# Patient Record
Sex: Female | Born: 1950 | Race: White | Hispanic: No | Smoking: Never smoker
Health system: Southern US, Community
[De-identification: ages and names within clinical notes are randomized; demographics above are authoritative.]

## PROBLEM LIST (undated history)

## (undated) DIAGNOSIS — R112 Nausea with vomiting, unspecified: Secondary | ICD-10-CM

## (undated) DIAGNOSIS — F329 Major depressive disorder, single episode, unspecified: Secondary | ICD-10-CM

## (undated) DIAGNOSIS — Z9889 Other specified postprocedural states: Secondary | ICD-10-CM

## (undated) DIAGNOSIS — I10 Essential (primary) hypertension: Secondary | ICD-10-CM

## (undated) DIAGNOSIS — F32A Depression, unspecified: Secondary | ICD-10-CM

## (undated) DIAGNOSIS — F419 Anxiety disorder, unspecified: Secondary | ICD-10-CM

## (undated) DIAGNOSIS — J069 Acute upper respiratory infection, unspecified: Secondary | ICD-10-CM

## (undated) DIAGNOSIS — R0602 Shortness of breath: Secondary | ICD-10-CM

## (undated) HISTORY — PX: TONSILLECTOMY: SUR1361

## (undated) HISTORY — PX: APPENDECTOMY: SHX54

## (undated) HISTORY — PX: STAPEDECTOMY: SHX2435

## (undated) HISTORY — PX: ELBOW SURGERY: SHX618

---

## 1997-12-20 ENCOUNTER — Other Ambulatory Visit: Admission: RE | Admit: 1997-12-20 | Discharge: 1997-12-20 | Payer: Self-pay | Admitting: Gynecology

## 1999-01-12 ENCOUNTER — Other Ambulatory Visit: Admission: RE | Admit: 1999-01-12 | Discharge: 1999-01-12 | Payer: Self-pay | Admitting: Gynecology

## 2000-02-01 ENCOUNTER — Other Ambulatory Visit: Admission: RE | Admit: 2000-02-01 | Discharge: 2000-02-01 | Payer: Self-pay | Admitting: Gynecology

## 2001-04-23 ENCOUNTER — Other Ambulatory Visit: Admission: RE | Admit: 2001-04-23 | Discharge: 2001-04-23 | Payer: Self-pay | Admitting: Gynecology

## 2004-11-07 ENCOUNTER — Other Ambulatory Visit: Admission: RE | Admit: 2004-11-07 | Discharge: 2004-11-07 | Payer: Self-pay | Admitting: Obstetrics & Gynecology

## 2006-12-10 LAB — HM COLONOSCOPY

## 2011-03-20 ENCOUNTER — Encounter (HOSPITAL_COMMUNITY)
Admission: RE | Admit: 2011-03-20 | Discharge: 2011-03-20 | Disposition: A | Discharge status: Home / Self Care | Payer: BC Managed Care – PPO | Source: Ambulatory Visit | Attending: Neurosurgery | Admitting: Neurosurgery

## 2011-03-20 ENCOUNTER — Encounter (HOSPITAL_COMMUNITY): Payer: Self-pay

## 2011-03-20 ENCOUNTER — Encounter (HOSPITAL_COMMUNITY): Payer: Self-pay | Admitting: Anesthesiology

## 2011-03-20 HISTORY — DX: Essential (primary) hypertension: I10

## 2011-03-20 HISTORY — DX: Anxiety disorder, unspecified: F41.9

## 2011-03-20 HISTORY — DX: Shortness of breath: R06.02

## 2011-03-20 HISTORY — DX: Acute upper respiratory infection, unspecified: J06.9

## 2011-03-20 HISTORY — DX: Depression, unspecified: F32.A

## 2011-03-20 HISTORY — DX: Major depressive disorder, single episode, unspecified: F32.9

## 2011-03-20 HISTORY — DX: Nausea with vomiting, unspecified: Z98.890

## 2011-03-20 HISTORY — DX: Nausea with vomiting, unspecified: R11.2

## 2011-03-20 LAB — BASIC METABOLIC PANEL
BUN: 12 mg/dL (ref 6–23)
Chloride: 103 mEq/L (ref 96–112)
GFR calc non Af Amer: >90 mL/min (ref >90)
Glucose, Bld: 105 mg/dL — ABNORMAL HIGH (ref 70–99)
Potassium: 4 mEq/L (ref 3.5–5.1)
Sodium: 142 mEq/L (ref 135–145)

## 2011-03-20 LAB — SURGICAL PCR SCREEN: Staphylococcus aureus: NEGATIVE

## 2011-03-20 LAB — CBC
HCT: 39.5 % (ref 36.0–46.0)
Hemoglobin: 13.2 g/dL (ref 12.0–15.0)
MCHC: 33.4 g/dL (ref 30.0–36.0)
RBC: 4.22 MIL/uL (ref 3.87–5.11)
WBC: 6.6 10*3/uL (ref 4.0–10.5)

## 2011-03-20 NOTE — Pre-Procedure Instructions (Signed)
[name]  03/20/2011   Your procedure is scheduled on:  03/28/11 wednesday  Report to [facility] at 6:30 AM.  Call this number if you have problems the morning of surgery: [phone]   Remember:   Do not eat food:After Midnight.  Do not drink clear liquids: 4 Hours before arrival.  Take these medicines the morning of surgery with A SIP OF WATER: albuterol,zyrtec,nasonex,dulera,percocet,zoloft,diovan   Do not wear jewelry, make-up or nail polish.  Do not wear lotions, powders, or perfumes. You may wear deodorant.  Do not shave 48 hours prior to surgery.  Do not bring valuables to the hospital.  Contacts, dentures or bridgework may not be worn into surgery.  Leave suitcase in the car. After surgery it may be brought to your room.  For patients admitted to the hospital, checkout time is 11:00 AM the day of discharge.   Patients discharged the day of surgery will not be allowed to drive home.  Name and phone number of your driver:  dorinda stehr [phone] or peggy hinshaw [phone]  Special Instructions: CHG Shower Use Special Wash: 1/2 bottle night before surgery and 1/2 bottle morning of surgery.   Please read over the following fact sheets that you were given: Coughing and Deep Breathing, MRSA Information and Surgical Site Infection Prevention

## 2011-03-21 NOTE — Anesthesia Preprocedure Evaluation (Addendum)
Anesthesia Evaluation  Patient identified by MRN, date of birth, ID band Patient awake    Reviewed: Allergy & Precautions, H&P , NPO status , Patient's Chart, lab work & pertinent test results  History of Anesthesia Complications (+) PONV  Airway Mallampati: II TM Distance: >3 FB Neck ROM: Full    Dental  (+) Dental Advisory Given and Dental Advidsory Given,    Pulmonary shortness of breath and with exertion, asthma , Recent URI , Resolved,          Cardiovascular hypertension, Pt. on medications and Pt. on home beta blockers     Neuro/Psych PSYCHIATRIC DISORDERS Anxiety Depression    GI/Hepatic   Endo/Other    Renal/GU      Musculoskeletal   Abdominal   Peds  Hematology   Anesthesia Other Findings   Reproductive/Obstetrics                         Anesthesia Physical Anesthesia Plan  ASA: III  Anesthesia Plan: General   Post-op Pain Management:    Induction: Intravenous  Airway Management Planned: Oral ETT  Additional Equipment:   Intra-op Plan:   Post-operative Plan: Extubation in OR  Informed Consent: I have reviewed the patients History and Physical, chart, labs and discussed the procedure including the risks, benefits and alternatives for the proposed anesthesia with the patient or authorized representative who has indicated his/her understanding and acceptance.   Dental advisory given  Plan Discussed with: CRNA, Anesthesiologist and Surgeon  Anesthesia Plan Comments:       Anesthesia Quick Evaluation

## 2011-03-22 NOTE — Pre-Procedure Instructions (Signed)
This

## 2011-03-24 NOTE — Preoperative (Addendum)
Beta Blockers   Reason not to administer Beta Blockers:Not Applicable 

## 2011-03-24 NOTE — Transfer of Care (Signed)
Immediate Anesthesia Transfer of Care Note  Patient: Penny Vega  Procedure(s) Performed:  LUMBAR LAMINECTOMY/DECOMPRESSION MICRODISCECTOMY - L2-3 Left Microdiscectomy  Patient Location: PACU  Anesthesia Type: General  Level of Consciousness: awake and alert   Airway & Oxygen Therapy: Patient Spontanous Breathing and Patient connected to nasal cannula oxygen  Post-op Assessment: Report given to PACU RN  Post vital signs: Reviewed and stable  Complications: No apparent anesthesia complications

## 2011-03-24 NOTE — Transfer of Care (Signed)
Immediate Anesthesia Transfer of Care Note  Patient: Penny Vega  Procedure(s) Performed:  LUMBAR LAMINECTOMY/DECOMPRESSION MICRODISCECTOMY - L2-3 Left Microdiscectomy  Patient Location: PACU  Anesthesia Type: General  Level of Consciousness: awake, alert  and oriented  Airway & Oxygen Therapy: Patient connected to nasal cannula oxygen  Post-op Assessment: Report given to PACU RN  Post vital signs: Reviewed and stable  Complications: No apparent anesthesia complications

## 2011-03-27 ENCOUNTER — Encounter (HOSPITAL_COMMUNITY): Payer: Self-pay

## 2011-03-27 MED ORDER — CEFAZOLIN SODIUM 1-5 GM-% IV SOLN
1.0000 g | INTRAVENOUS | Status: DC
  Filled 2011-03-27: qty 50

## 2011-03-28 ENCOUNTER — Encounter (HOSPITAL_COMMUNITY): Payer: Self-pay | Admitting: Anesthesiology

## 2011-03-28 ENCOUNTER — Ambulatory Visit (HOSPITAL_COMMUNITY): Payer: BC Managed Care – PPO

## 2011-03-28 ENCOUNTER — Encounter (HOSPITAL_COMMUNITY): Payer: Self-pay

## 2011-03-28 ENCOUNTER — Ambulatory Visit (HOSPITAL_COMMUNITY)
Admission: RE | Admit: 2011-03-28 | Discharge: 2011-03-29 | Disposition: A | Discharge status: Home / Self Care | Payer: BC Managed Care – PPO | Source: Ambulatory Visit | Attending: Neurosurgery | Admitting: Neurosurgery

## 2011-03-28 ENCOUNTER — Encounter (HOSPITAL_COMMUNITY)
Admission: RE | Disposition: A | Discharge status: Home / Self Care | Payer: Self-pay | Source: Ambulatory Visit | Attending: Neurosurgery

## 2011-03-28 DIAGNOSIS — F3289 Other specified depressive episodes: Secondary | ICD-10-CM | POA: Insufficient documentation

## 2011-03-28 DIAGNOSIS — Z01812 Encounter for preprocedural laboratory examination: Secondary | ICD-10-CM | POA: Insufficient documentation

## 2011-03-28 DIAGNOSIS — F411 Generalized anxiety disorder: Secondary | ICD-10-CM | POA: Insufficient documentation

## 2011-03-28 DIAGNOSIS — I10 Essential (primary) hypertension: Secondary | ICD-10-CM | POA: Insufficient documentation

## 2011-03-28 DIAGNOSIS — J45909 Unspecified asthma, uncomplicated: Secondary | ICD-10-CM | POA: Insufficient documentation

## 2011-03-28 DIAGNOSIS — Z01810 Encounter for preprocedural cardiovascular examination: Secondary | ICD-10-CM | POA: Insufficient documentation

## 2011-03-28 DIAGNOSIS — M5126 Other intervertebral disc displacement, lumbar region: Secondary | ICD-10-CM | POA: Insufficient documentation

## 2011-03-28 DIAGNOSIS — F329 Major depressive disorder, single episode, unspecified: Secondary | ICD-10-CM | POA: Insufficient documentation

## 2011-03-28 HISTORY — PX: LUMBAR LAMINECTOMY/DECOMPRESSION MICRODISCECTOMY: SHX5026

## 2011-03-28 SURGERY — LUMBAR LAMINECTOMY/DECOMPRESSION MICRODISCECTOMY
Anesthesia: General | Site: Back | Laterality: Left | Wound class: Clean

## 2011-03-28 MED ORDER — CEFAZOLIN SODIUM 1-5 GM-% IV SOLN
1.0000 g | Freq: Three times a day (TID) | INTRAVENOUS | Status: AC
  Administered 2011-03-28 (×2): 1 g via INTRAVENOUS
  Filled 2011-03-28 (×2): qty 50

## 2011-03-28 MED ORDER — PHENOL 1.4 % MT LIQD: 1.0000 | OROMUCOSAL | Status: DC | PRN

## 2011-03-28 MED ORDER — LACTATED RINGERS IV SOLN
INTRAVENOUS | Status: DC | PRN
  Administered 2011-03-28 (×2): via INTRAVENOUS

## 2011-03-28 MED ORDER — EPHEDRINE SULFATE 50 MG/ML IJ SOLN
INTRAMUSCULAR | Status: DC | PRN
  Administered 2011-03-28 (×2): 10 mg via INTRAVENOUS

## 2011-03-28 MED ORDER — MORPHINE SULFATE 2 MG/ML IJ SOLN: 0.0500 mg/kg | INTRAMUSCULAR | Status: DC | PRN

## 2011-03-28 MED ORDER — HYDROMORPHONE HCL PF 1 MG/ML IJ SOLN: 0.2500 mg | INTRAMUSCULAR | Status: DC | PRN

## 2011-03-28 MED ORDER — HYDROMORPHONE HCL PF 1 MG/ML IJ SOLN
0.2500 mg | INTRAMUSCULAR | Status: DC | PRN
  Administered 2011-03-28: 0.25 mg via INTRAVENOUS
  Administered 2011-03-28: 0.5 mg via INTRAVENOUS

## 2011-03-28 MED ORDER — CEFAZOLIN SODIUM 1-5 GM-% IV SOLN
INTRAVENOUS | Status: DC | PRN
  Administered 2011-03-28: 1 g via INTRAVENOUS

## 2011-03-28 MED ORDER — HYDROMORPHONE HCL PF 1 MG/ML IJ SOLN
0.5000 mg | INTRAMUSCULAR | Status: DC | PRN
  Administered 2011-03-28: 1 mg via INTRAVENOUS
  Filled 2011-03-28: qty 1

## 2011-03-28 MED ORDER — PHENYLEPHRINE HCL 10 MG/ML IJ SOLN
INTRAMUSCULAR | Status: DC | PRN
  Administered 2011-03-28 (×2): 160 ug via INTRAVENOUS
  Administered 2011-03-28: 80 ug via INTRAVENOUS
  Administered 2011-03-28: 240 ug via INTRAVENOUS

## 2011-03-28 MED ORDER — SUCCINYLCHOLINE CHLORIDE 20 MG/ML IJ SOLN
INTRAMUSCULAR | Status: DC | PRN
  Administered 2011-03-28: 50 mg via INTRAVENOUS

## 2011-03-28 MED ORDER — DEXAMETHASONE SODIUM PHOSPHATE 4 MG/ML IJ SOLN
INTRAMUSCULAR | Status: DC | PRN
  Administered 2011-03-28: 10 mg via INTRAVENOUS

## 2011-03-28 MED ORDER — CYCLOBENZAPRINE HCL 10 MG PO TABS
10.0000 mg | ORAL_TABLET | Freq: Three times a day (TID) | ORAL | Status: DC | PRN
  Administered 2011-03-28: 10 mg via ORAL
  Filled 2011-03-28: qty 1

## 2011-03-28 MED ORDER — DOCUSATE SODIUM 100 MG PO CAPS
100.0000 mg | ORAL_CAPSULE | Freq: Two times a day (BID) | ORAL | Status: DC
  Administered 2011-03-28: 100 mg via ORAL
  Filled 2011-03-28: qty 1

## 2011-03-28 MED ORDER — GLYCOPYRROLATE 0.2 MG/ML IJ SOLN
INTRAMUSCULAR | Status: DC | PRN
  Administered 2011-03-28: .8 mg via INTRAVENOUS

## 2011-03-28 MED ORDER — FENTANYL CITRATE 0.05 MG/ML IJ SOLN
INTRAMUSCULAR | Status: DC | PRN
  Administered 2011-03-28: 100 ug via INTRAVENOUS
  Administered 2011-03-28: 50 ug via INTRAVENOUS

## 2011-03-28 MED ORDER — LACTATED RINGERS IV SOLN: INTRAVENOUS | Status: DC

## 2011-03-28 MED ORDER — NEOSTIGMINE METHYLSULFATE 1 MG/ML IJ SOLN
INTRAMUSCULAR | Status: DC | PRN
  Administered 2011-03-28: 4 mg via INTRAVENOUS

## 2011-03-28 MED ORDER — LIDOCAINE-EPINEPHRINE 1 %-1:100000 IJ SOLN
INTRAMUSCULAR | Status: DC | PRN
  Administered 2011-03-28: 10 mL via INTRADERMAL

## 2011-03-28 MED ORDER — PROPOFOL 10 MG/ML IV EMUL
INTRAVENOUS | Status: DC | PRN
  Administered 2011-03-28: 150 mg via INTRAVENOUS

## 2011-03-28 MED ORDER — SODIUM CHLORIDE 0.9 % IR SOLN
Status: DC | PRN
  Administered 2011-03-28: 09:00:00

## 2011-03-28 MED ORDER — BUPIVACAINE HCL (PF) 0.25 % IJ SOLN
INTRAMUSCULAR | Status: DC | PRN
  Administered 2011-03-28: 10 mL

## 2011-03-28 MED ORDER — ONDANSETRON HCL 4 MG/2ML IJ SOLN
INTRAMUSCULAR | Status: DC | PRN
  Administered 2011-03-28: 4 mg via INTRAVENOUS

## 2011-03-28 MED ORDER — SODIUM CHLORIDE 0.9 % IJ SOLN: 3.0000 mL | Freq: Two times a day (BID) | INTRAMUSCULAR | Status: DC

## 2011-03-28 MED ORDER — MIDAZOLAM HCL 5 MG/5ML IJ SOLN
INTRAMUSCULAR | Status: DC | PRN
  Administered 2011-03-28: 2 mg via INTRAVENOUS

## 2011-03-28 MED ORDER — VECURONIUM BROMIDE 10 MG IV SOLR
INTRAVENOUS | Status: DC | PRN
  Administered 2011-03-28: 5 mg via INTRAVENOUS

## 2011-03-28 MED ORDER — OXYCODONE-ACETAMINOPHEN 5-325 MG PO TABS
1.0000 | ORAL_TABLET | ORAL | Status: DC | PRN
  Administered 2011-03-28 (×2): 1 via ORAL
  Administered 2011-03-28 – 2011-03-29 (×3): 2 via ORAL
  Filled 2011-03-28 (×4): qty 2

## 2011-03-28 MED ORDER — MENTHOL 3 MG MT LOZG: 1.0000 | LOZENGE | OROMUCOSAL | Status: DC | PRN

## 2011-03-28 MED ORDER — ACETAMINOPHEN 325 MG PO TABS: 650.0000 mg | ORAL_TABLET | ORAL | Status: DC | PRN

## 2011-03-28 MED ORDER — ACETAMINOPHEN 650 MG RE SUPP: 650.0000 mg | RECTAL | Status: DC | PRN

## 2011-03-28 MED ORDER — THROMBIN 5000 UNITS EX KIT
PACK | CUTANEOUS | Status: DC | PRN
  Administered 2011-03-28: 10000 [IU] via TOPICAL

## 2011-03-28 SURGICAL SUPPLY — 54 items
ADH SKN CLS APL DERMABOND .7 (GAUZE/BANDAGES/DRESSINGS) ×1
APL SKNCLS STERI-STRIP NONHPOA (GAUZE/BANDAGES/DRESSINGS) ×1
BAG DECANTER FOR FLEXI CONT (MISCELLANEOUS) ×2 IMPLANT
BENZOIN TINCTURE PRP APPL 2/3 (GAUZE/BANDAGES/DRESSINGS) ×2 IMPLANT
BLADE SURG 11 STRL SS (BLADE) ×2 IMPLANT
BLADE SURG ROTATE 9660 (MISCELLANEOUS) IMPLANT
BRUSH SCRUB EZ PLAIN DRY (MISCELLANEOUS) ×2 IMPLANT
BUR MATCHSTICK NEURO 3.0 LAGG (BURR) ×2 IMPLANT
BUR PRECISION FLUTE 6.0 (BURR) ×2 IMPLANT
CANISTER SUCTION 2500CC (MISCELLANEOUS) ×2 IMPLANT
CLOSURE STERI STRIP 1/2 X4 (GAUZE/BANDAGES/DRESSINGS) ×2 IMPLANT
CLOTH BEACON ORANGE TIMEOUT ST (SAFETY) ×2 IMPLANT
CONT SPEC 4OZ CLIKSEAL STRL BL (MISCELLANEOUS) ×2 IMPLANT
DECANTER SPIKE VIAL GLASS SM (MISCELLANEOUS) ×2 IMPLANT
DERMABOND ADVANCED (GAUZE/BANDAGES/DRESSINGS) ×1
DERMABOND ADVANCED .7 DNX12 (GAUZE/BANDAGES/DRESSINGS) ×1 IMPLANT
DRAPE LAPAROTOMY 100X72X124 (DRAPES) ×2 IMPLANT
DRAPE MICROSCOPE ZEISS OPMI (DRAPES) ×2 IMPLANT
DRAPE POUCH INSTRU U-SHP 10X18 (DRAPES) ×2 IMPLANT
DRAPE PROXIMA HALF (DRAPES) IMPLANT
DRAPE SURG 17X23 STRL (DRAPES) ×2 IMPLANT
DRSG OPSITE 4X5.5 SM (GAUZE/BANDAGES/DRESSINGS) ×2 IMPLANT
ELECT REM PT RETURN 9FT ADLT (ELECTROSURGICAL) ×2
ELECTRODE REM PT RTRN 9FT ADLT (ELECTROSURGICAL) ×1 IMPLANT
GAUZE SPONGE 4X4 12PLY STRL LF (GAUZE/BANDAGES/DRESSINGS) ×2 IMPLANT
GAUZE SPONGE 4X4 16PLY XRAY LF (GAUZE/BANDAGES/DRESSINGS) IMPLANT
GLOVE BIO SURGEON STRL SZ8 (GLOVE) ×4 IMPLANT
GLOVE ECLIPSE 7.5 STRL STRAW (GLOVE) IMPLANT
GLOVE EXAM NITRILE LRG STRL (GLOVE) IMPLANT
GLOVE EXAM NITRILE MD LF STRL (GLOVE) ×2 IMPLANT
GLOVE EXAM NITRILE XL STR (GLOVE) IMPLANT
GLOVE EXAM NITRILE XS STR PU (GLOVE) IMPLANT
GLOVE INDICATOR 8.5 STRL (GLOVE) ×2 IMPLANT
GOWN BRE IMP SLV AUR LG STRL (GOWN DISPOSABLE) ×2 IMPLANT
GOWN BRE IMP SLV AUR XL STRL (GOWN DISPOSABLE) ×4 IMPLANT
GOWN STRL REIN 2XL LVL4 (GOWN DISPOSABLE) IMPLANT
KIT BASIN OR (CUSTOM PROCEDURE TRAY) ×2 IMPLANT
KIT ROOM TURNOVER OR (KITS) ×2 IMPLANT
NEEDLE HYPO 22GX1.5 SAFETY (NEEDLE) ×2 IMPLANT
NEEDLE SPNL 22GX3.5 QUINCKE BK (NEEDLE) ×2 IMPLANT
NS IRRIG 1000ML POUR BTL (IV SOLUTION) ×2 IMPLANT
PACK LAMINECTOMY NEURO (CUSTOM PROCEDURE TRAY) ×2 IMPLANT
RUBBERBAND STERILE (MISCELLANEOUS) ×4 IMPLANT
SPONGE GAUZE 4X4 12PLY (GAUZE/BANDAGES/DRESSINGS) ×2 IMPLANT
SPONGE SURGIFOAM ABS GEL SZ50 (HEMOSTASIS) ×2 IMPLANT
STRIP CLOSURE SKIN 1/2X4 (GAUZE/BANDAGES/DRESSINGS) ×2 IMPLANT
SUT VIC AB 0 CT1 18XCR BRD8 (SUTURE) ×1 IMPLANT
SUT VIC AB 0 CT1 8-18 (SUTURE) ×2
SUT VIC AB 2-0 CT1 18 (SUTURE) ×2 IMPLANT
SUT VICRYL 4-0 PS2 18IN ABS (SUTURE) ×2 IMPLANT
SYR 20ML ECCENTRIC (SYRINGE) ×2 IMPLANT
TOWEL OR 17X24 6PK STRL BLUE (TOWEL DISPOSABLE) ×2 IMPLANT
TOWEL OR 17X26 10 PK STRL BLUE (TOWEL DISPOSABLE) ×2 IMPLANT
WATER STERILE IRR 1000ML POUR (IV SOLUTION) ×2 IMPLANT

## 2011-03-28 NOTE — H&P (Signed)
Penny Vega is an 60 y.o. female.   Chief Complaint: Back and left leg pain HPI: The patient is a 60 year old female who presents for evaluation of back and left leg pain that began back in August. She reports the pain radiates to her left buttock down the anterior margins of her left quad and inner part of her left thigh. This is refractory to all forms of conservative treatment. To include but not limited to anti-inflammatories prednisone physical therapy. The patient now presents for lumbar laminectomy microdiscectomy.  Past Medical History  Diagnosis Date  . PONV (postoperative nausea and vomiting)   . Asthma     last attack was 06/2010  . Shortness of breath     with asthma symptoms  . Recurrent upper respiratory infection (URI)     bronchitis early spring  . Anxiety   . Depression   . Hypertension     Past Surgical History  Procedure Date  . Tonsillectomy   . Appendectomy   . Stapedectomy 20 yrs. and 25 yrs. ago  . Elbow surgery 10 yrs. ago    left     No family history on file. Social History:  reports that she has never smoked. She does not have any smokeless tobacco history on file. She reports that she does not drink alcohol or use illicit drugs.  Allergies:  Allergies  Allergen Reactions  . No Known Drug Allergy     Medications Prior to Admission  Medication Dose Route Frequency Provider Last Rate Last Dose  . ceFAZolin (ANCEF) IVPB 1 g/50 mL premix  1 g Intravenous 60 min Pre-Op Mariam Dollar       Medications Prior to Admission  Medication Sig Dispense Refill  . albuterol (PROVENTIL HFA;VENTOLIN HFA) 108 (90 BASE) MCG/ACT inhaler Inhale 2 puffs into the lungs every 6 (six) hours as needed. For asthma       . ALPRAZolam (XANAX) 0.5 MG tablet Take 0.25 mg by mouth at bedtime as needed. For sleep       . amitriptyline (ELAVIL) 25 MG tablet Take 25 mg by mouth at bedtime.        Marland Kitchen atorvastatin (LIPITOR) 40 MG tablet Take 40 mg by mouth at bedtime.        .  cetirizine (ZYRTEC) 10 MG tablet Take 10 mg by mouth daily as needed. For allergies       . naproxen (NAPROSYN) 500 MG tablet Take 500 mg by mouth 2 (two) times daily as needed. For back pain       . Olopatadine HCl (PATANASE NA) Place 2 sprays into the nose daily as needed. For allergies        . omega-3 acid ethyl esters (LOVAZA) 1 G capsule Take 3 g by mouth daily.        Marland Kitchen oxyCODONE-acetaminophen (PERCOCET) 10-325 MG per tablet Take 1 tablet by mouth every 6 (six) hours as needed. For back pain       . sertraline (ZOLOFT) 100 MG tablet Take 150 mg by mouth daily.        . valsartan-hydrochlorothiazide (DIOVAN-HCT) 320-25 MG per tablet Take 1 tablet by mouth daily.        . mometasone (NASONEX) 50 MCG/ACT nasal spray Place 2 sprays into the nose daily as needed. For allergies        . Mometasone Furo-Formoterol Fum (DULERA) 200-5 MCG/ACT AERO Inhale 2 puffs into the lungs 2 (two) times daily as needed. For asthma  No results found for this or any previous visit (from the past 48 hour(s)). No results found.  Review of Systems  Constitutional: Negative.   HENT: Negative.   Eyes: Negative.   Respiratory: Positive for shortness of breath.   Cardiovascular: Negative.   Gastrointestinal: Negative.   Genitourinary: Negative.   Musculoskeletal: Positive for myalgias and back pain.  Skin: Negative.   Neurological: Positive for tingling.  Endo/Heme/Allergies: Negative.   Psychiatric/Behavioral: Negative.     Blood pressure 104/67, pulse 90, temperature 98 F (36.7 C), temperature source Oral, resp. rate 20, SpO2 97.00%. Physical Exam  Constitutional: She appears well-developed and well-nourished.  HENT:  Head: Normocephalic and atraumatic.  Eyes: Pupils are equal, round, and reactive to light.  Neck: Neck supple.  Respiratory: Effort normal.  GI: Soft.  Musculoskeletal: Normal range of motion.  Neurological: She is alert. She has normal strength. She displays abnormal  reflex. GCS eye subscore is 4. GCS verbal subscore is 5. GCS motor subscore is 6.  Reflex Scores:      Patellar reflexes are 1+ on the right side and 0 on the left side.      The patient's strength assessment shows her to be 5 out of 5 strength in her iliopsoas quads hamstrings gastroc EHL.     Assessment/Plan The patient clinically has a left L2 and L3 radiculopathy from a ruptured disc at L2-3 on the left. Patient presents for lumbar laminectomy laminectomy and microdiscectomy L2-3 left. The risks and benefits, alternatives, and expectations of recovery, and perioperative course.  She understands and agrees to proceed forward.  Jaquesha Boroff P 03/28/2011, 7:54 AM

## 2011-03-28 NOTE — Anesthesia Postprocedure Evaluation (Signed)
  Anesthesia Post-op Note  Patient: Penny Vega  Procedure(s) Performed:  LUMBAR LAMINECTOMY/DECOMPRESSION MICRODISCECTOMY - Lumbar Two-Three Left Microdiscectomy  Patient Location: PACU  Anesthesia Type: General  Level of Consciousness: awake  Airway and Oxygen Therapy: Patient Spontanous Breathing  Post-op Pain: mild  Post-op Assessment: Post-op Vital signs reviewed  Post-op Vital Signs: stable  Complications: No apparent anesthesia complications

## 2011-03-28 NOTE — Anesthesia Postprocedure Evaluation (Signed)
  Anesthesia Post-op Note  Patient: Penny Vega  Procedure(s) Performed:  LUMBAR LAMINECTOMY/DECOMPRESSION MICRODISCECTOMY - Lumbar Two-Three Left Microdiscectomy  Patient Location: PACU  Anesthesia Type: General  Level of Consciousness: awake  Airway and Oxygen Therapy: Patient Spontanous Breathing  Post-op Pain: mild  Post-op Assessment: Post-op Vital signs reviewed  Post-op Vital Signs: stable  Complications: No apparent anesthesia complications 

## 2011-03-28 NOTE — Transfer of Care (Signed)
Immediate Anesthesia Transfer of Care Note  Patient: Penny Vega  Procedure(s) Performed:  LUMBAR LAMINECTOMY/DECOMPRESSION MICRODISCECTOMY - Lumbar Two-Three Left Microdiscectomy  Patient Location: PACU  Anesthesia Type: General  Level of Consciousness: awake and oriented  Airway & Oxygen Therapy: Patient Spontanous Breathing and Patient connected to nasal cannula oxygen  Post-op Assessment: Report given to PACU RN, Post -op Vital signs reviewed and stable and Patient moving all extremities  Post vital signs: Reviewed and stable  Complications: No apparent anesthesia complications

## 2011-03-28 NOTE — Anesthesia Procedure Notes (Addendum)
Procedure Name: Intubation Date/Time: 03/28/2011 8:34 AM Performed by: Carmela Rima Pre-anesthesia Checklist: Patient identified, Emergency Drugs available, Suction available, Patient being monitored and Timeout performed Patient Re-evaluated:Patient Re-evaluated prior to inductionOxygen Delivery Method: Circle System Utilized Preoxygenation: Pre-oxygenation with 100% oxygen Intubation Type: IV induction and Rapid sequence Ventilation: Mask ventilation without difficulty Laryngoscope Size: Miller and 2 Grade View: Grade II Tube type: Oral Tube size: 7.5 mm Number of attempts: 2 Placement Confirmation: ETT inserted through vocal cords under direct vision,  positive ETCO2,  CO2 detector and breath sounds checked- equal and bilateral (placed by Dr. Randa Evens) Secured at: 22 cm Tube secured with: Tape Dental Injury: Injury to tongue and Injury to lip  Difficulty Due To: Difficult Airway- due to anterior larynx and Difficult Airway- due to limited oral opening

## 2011-03-28 NOTE — Op Note (Signed)
Preoperative diagnosis: Left L2-L3 radiculopathy from ruptured disc L2-3 left  Postoperative diagnosis: Same  Procedure: Left-sided lumbar laminectomy and microdiscectomy L2-3, with microdissection of the left L2 and L3 nerve roots.  Surgeon: Donzetta Sprung Tadao Emig  Assistant: Tia Alert  Anesthesia: Gen.  EBL: Less than 100 cc  History of present illness: Patient is a very pleasant 60 year old female who presents with left-sided hip and leg pain radiating to her anterior quad consistent with an L2-L3 nerve root pattern. Patient failed all forms of conservative treatment. Imaging findings are consistent with a large L2-3 disc herniation. Her clinical exam was consistent with an L2-3 radiculopathy. She was explained the risks and benefits of surgery, alternatives, expected recovery and outcomes., And perioperative course. She understands these and agrees to proceed forward.  Operative procedure: Patient was brought in the or was induced under general anesthesia was positioned prone the Wilson frame. Her back was prepped and agrees to fashion. Preoperative x-ray to localize the appropriate level. After infiltration 10 cc lidocaine with epi a midline incision was made over light cautery was used to gas exchange tissues. Subperiosteal dissection was carried out on the L2 and L3 lamina. Interoperative x-ray confirmed this to be the appropriate level. The using a high-speed drill the interest of the lateral to medial facet complexes suppressant of L3 was drilled down. Using a 2 and 3 mm Kerrison punch laminotomy was begun the ligament was removed in piecemeal fashion exposing the thecal sac. At this point the operating microscope was draped and brought in the field and the under microscopic illumination the undersurface of the medial gutter was further of didn't identify the L3 pedicle. The L3 nerve root takeoff was then identified.  Epidural veins are coagulated overlying the disc space the disc spaces  identified. Working superiorly the space annulotomy was made with 11 blade scalpel. Using a blunt nerve hook several large fragments of disc removed from underneath the L2 nerve root medial to the L2 pedicle, and underneath the thecal sac. After several fragments and remove there is no further fragments appreciate this level.  The disc space was then inspected and felt to be compressing the proximal L3 nerve root. The disc space was incised and cleanout pituitary rongeurs Epstein curettes the at the end of the discectomy there is no further stenosis on thecal sac proximal takeoff of the L3 nerve root undersurface the L2 nerve root. There was a copiously irrigated meticulous hemostasis was maintained Gelfoam was laid over the dura. The wounds and closed in layers with interrupted Vicryl in the muscle and fascia and subcutaneous tissues. The 4-0 running subcuticular was then used to close the skin. The wounds and dressed and the patient recovered in stable condition at the end of case all needle counts sponge counts correct.

## 2011-03-29 MED ORDER — CYCLOBENZAPRINE HCL 10 MG PO TABS: 10.0000 mg | ORAL_TABLET | Freq: Three times a day (TID) | ORAL | Status: AC | PRN

## 2011-03-29 MED ORDER — OXYCODONE-ACETAMINOPHEN 5-325 MG PO TABS: 1.0000 | ORAL_TABLET | ORAL | Status: AC | PRN

## 2011-03-29 NOTE — Progress Notes (Addendum)
Subjective: Patient reports she is well with minimal leg pain and back pain well controlled on oral analgesics. She is in the living and voiding spontaneously.   Objective: Vital signs in last 24 hours: Temp:  [97.1 F (36.2 C)-98.7 F (37.1 C)] 98.3 F (36.8 C) (11/08 0400) Pulse Rate:  [74-110] 88  (11/08 0400) Resp:  [11-18] 16  (11/08 0400) BP: (92-122)/(47-68) 92/47 mmHg (11/08 0400) SpO2:  [92 %-99 %] 95 % (11/08 0400)  Intake/Output from previous day: 11/07 0701 - 11/08 0700 In: 4500 [P.O.:600; I.V.:3900] Out: 500 [Blood:500] Intake/Output this shift:   Patient is awake alert sitting up comfortably. her wound is clean and dry her strength is 5 out of 5.  Lab Results: No results found for this basename: WBC:2,HGB:2,HCT:2,PLT:2 in the last 72 hours BMET No results found for this basename: NA:2,K:2,CL:2,CO2:2,GLUCOSE:2,BUN:2,CREATININE:2,CALCIUM:2 in the last 72 hours  Studies/Results: Dg Lumbar Spine 2-3 Views Room 33  03/28/2011  *RADIOLOGY REPORT*  Clinical Data: L2-3 microdiskectomy.  LUMBAR SPINE - 2-3 VIEW  Comparison: CT 01/25/2011  Findings: First lateral intraoperative image demonstrates a posterior needle directed at the L3 spinous process.  Second lateral intraoperative image demonstrates posterior surgical instruments at the L2-3 level.  IMPRESSION: Intraoperative localization as above.  Original Report Authenticated By: Cyndie Chime, M.D.    Assessment/Plan: Patient will be discharged home and scheduled followup in approximately 1-2 weeks to be discharged on oxycodone for pain.  LOS: 1 day     Sundi Slevin P 03/29/2011, 8:37 AM

## 2011-03-29 NOTE — Discharge Summary (Signed)
  Date of admission: 03/28/2011  Date of discharge: 03/29/2011  Admitting diagnosis: Lumbar L3 radiculopathy from ruptured disc L2-3  Discharge diagnosis: Same  Procedure: Lumbar laminectomy microdiscectomy L2-3 left  Hospital course: Patient was admitted as an early morning admission went to the operating room underwent the aforementioned procedure. Postoperatively patient did very well went to the recovery room and the floor on the floor patient was angling and voiding spontaneously tolerating a regular diet. On postoperative day 1 she was still be discharged home. Was scheduled followup in approximately 1-2 weeks she'll be discharged on oxycodone for pain.

## 2011-04-04 ENCOUNTER — Encounter (HOSPITAL_COMMUNITY): Payer: Self-pay | Admitting: Neurosurgery

## 2011-07-04 LAB — HM COLONOSCOPY

## 2012-04-29 IMAGING — CR DG LUMBAR SPINE 2-3V
1 series · 1 of 1 positions shown · non-contrast
Comparison: CT 01/25/2011

CLINICAL DATA: L2-3 microdiskectomy.

LUMBAR SPINE - 2-3 VIEW

[view not recorded]
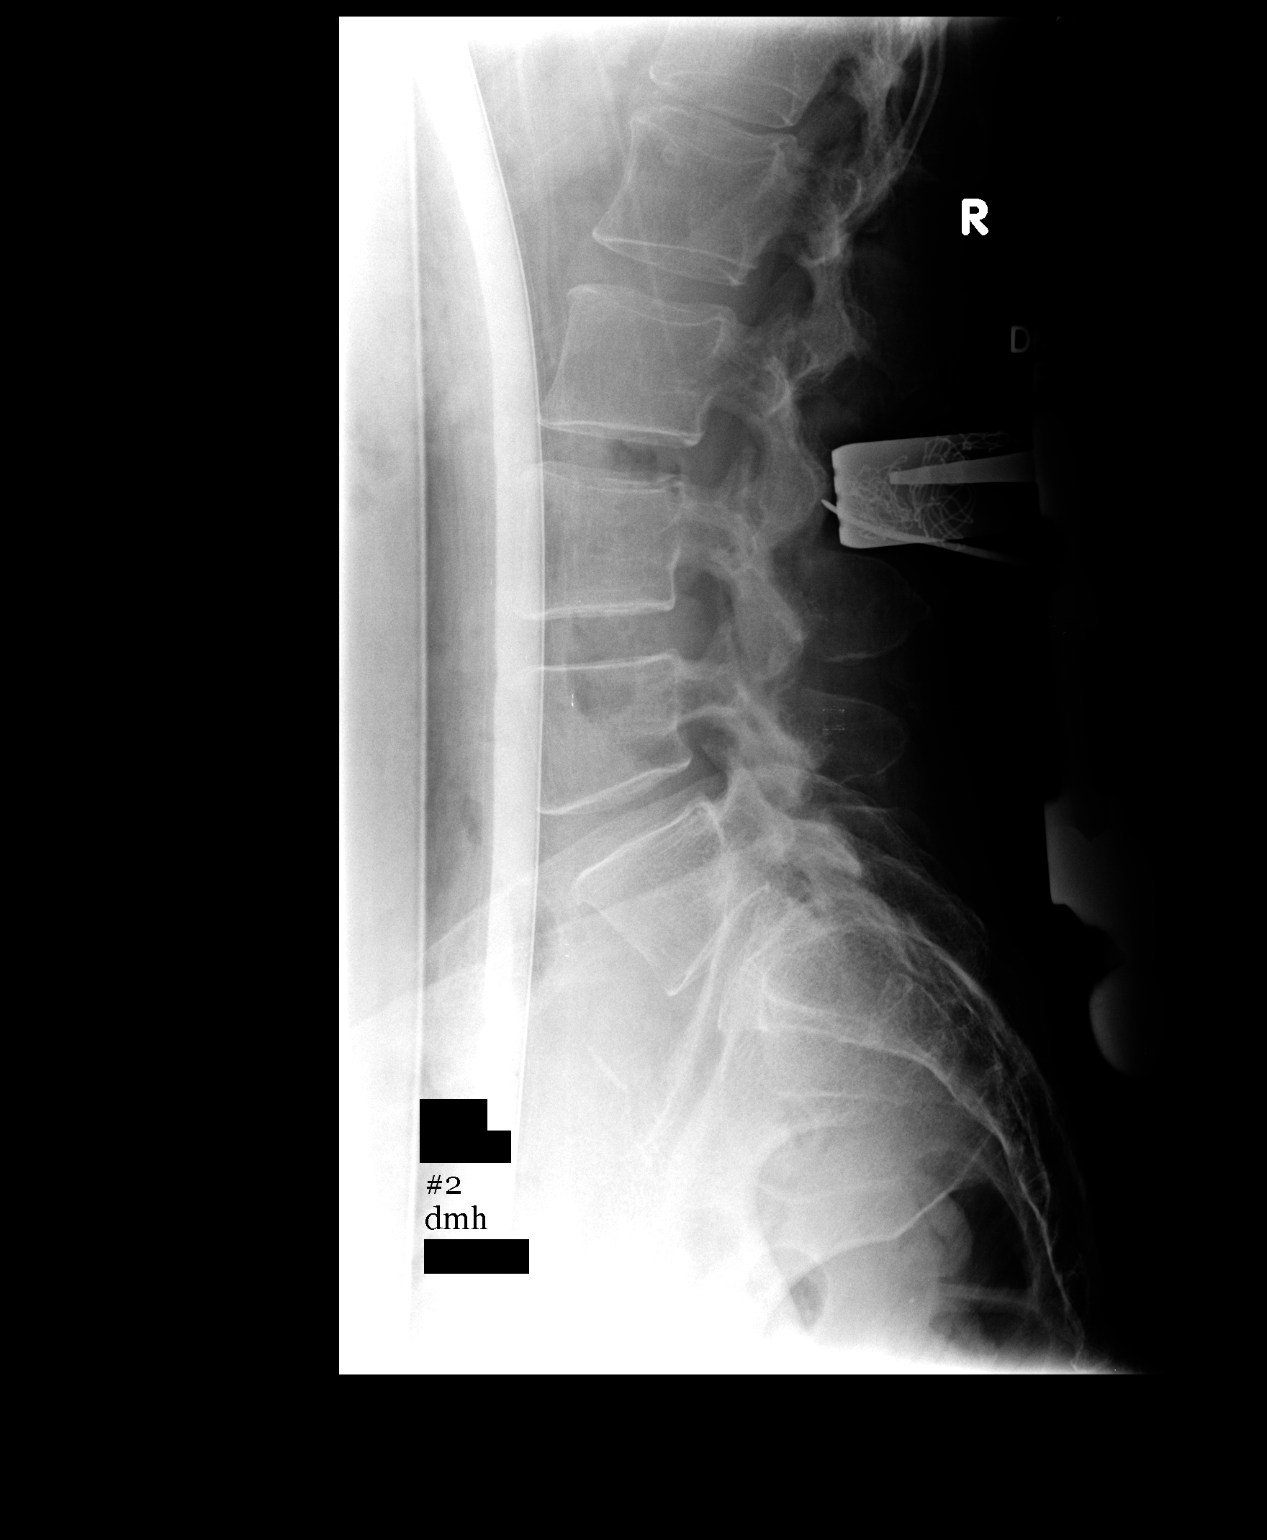

[1 of 1 positions shown; findings below may reference images not displayed]

FINDINGS: First lateral intraoperative image demonstrates a
posterior needle directed at the L3 spinous process.

Second lateral intraoperative image demonstrates posterior surgical
instruments at the L2-3 level.
IMPRESSION: Intraoperative localization as above.

## 2013-08-19 ENCOUNTER — Ambulatory Visit (INDEPENDENT_AMBULATORY_CARE_PROVIDER_SITE_OTHER): Payer: BC Managed Care – PPO

## 2013-08-19 VITALS — BP 124/78 | HR 79 | Resp 18

## 2013-08-19 DIAGNOSIS — S93409A Sprain of unspecified ligament of unspecified ankle, initial encounter: Secondary | ICD-10-CM

## 2013-08-19 DIAGNOSIS — M7750 Other enthesopathy of unspecified foot: Secondary | ICD-10-CM

## 2013-08-19 DIAGNOSIS — S93402A Sprain of unspecified ligament of left ankle, initial encounter: Secondary | ICD-10-CM

## 2013-08-19 DIAGNOSIS — M7752 Other enthesopathy of left foot: Secondary | ICD-10-CM

## 2013-08-19 DIAGNOSIS — M7672 Peroneal tendinitis, left leg: Secondary | ICD-10-CM

## 2013-08-19 NOTE — Progress Notes (Signed)
   Subjective:    Patient ID: Penny Vega, female    DOB: 09-Mar-1951, [age] y.o.   MRN: [id number]  HPI my left foot has been going on for about a month and hurts on the side and no burning and does throb and sore and tender and hurts with or without shoes and some swelling and there is no injury to it and went to orthopedics and they did x-rays and told to do exercises and gave anti-imflammorties     Review of Systems  Musculoskeletal: Positive for gait problem.       Joint pain   All other systems reviewed and are negative.       Objective:   Physical Exam Okay objective findings as follows vascular status is intact pedal pulses are palpable epicritic and proprioceptive sensations intact and symmetric bilateral normal plantar response DTRs not listed dermatologic the skin color pigment normal hair growth absent nails unremarkable orthopedic biomechanical exam rectus foot type ankle mid tarsus subtalar joint motions normal there is some pain and plantar flexion and inversion point in the sinus tarsi area also slight tenderness at the insertion of the peroneus brevis and lateral fifth metatarsal base area although the sinus tarsi significantly more painful and tender both and inversion and a direct palpation over the sinus tarsi and anterior talofibular ligament region. Patient does not describe any acute injury however does do yard work or lung work and wears a low top 10 shoe or sneaker and may walk uneven surfaces as a result may develop some chronic ankle instability or a low level grade 1 ankle sprain of the anterior talofibular ligament and posse. Tendinitis is a compensatory gait changes.       Assessment & Plan:  Assessment this time ankle sprain grade 1 no fracture or other osseous abnormalities are noted posse mild peroneal tendinitis. Plan at this time I use ankle stabilizer for to 4 weeks also recommend warm compress ice pack a shoes are only taking NSAIDs or anti-inflammatories  needed maintain a good stable shoe avoid any ballistic activities if he fails to improve in one month reappointed for followup and reevaluation may be K. for more invasive options consider steroid injection or further noninvasive studies it is still fails to improve followup in one month for reevaluation  Alvan Dameichard Karenna Romanoff DPM

## 2013-08-19 NOTE — Patient Instructions (Signed)
ICE INSTRUCTIONS  Apply ice or cold pack to the affected area at least 3 times a day for 10-15 minutes each time.  You should also use ice after prolonged activity or vigorous exercise.  Do not apply ice longer than 20 minutes at one time.  Always keep a cloth between your skin and the ice pack to prevent burns.  Being consistent and following these instructions will help control your symptoms.  We suggest you purchase a gel ice pack because they are reusable and do bit leak.  Some of them are designed to wrap around the area.  Use the method that works best for you.  Here are some other suggestions for icing.   Use a frozen bag of peas or corn-inexpensive and molds well to your body, usually stays frozen for 10 to 20 minutes.  Wet a towel with cold water and squeeze out the excess until it's damp.  Place in a bag in the freezer for 20 minutes. Then remove and use.  Recommend alternating hot and cold compresses repeat 2 or 3x10 minutes heat 10 minutes of ice 10 minutes of ice repeat 2 or 3 times.

## 2013-09-23 ENCOUNTER — Ambulatory Visit: Payer: BC Managed Care – PPO

## 2016-09-05 LAB — HM COLONOSCOPY

## 2017-03-18 ENCOUNTER — Ambulatory Visit (INDEPENDENT_AMBULATORY_CARE_PROVIDER_SITE_OTHER): Payer: BC Managed Care – PPO | Admitting: Allergy and Immunology

## 2017-03-18 ENCOUNTER — Encounter: Payer: Self-pay | Admitting: Allergy and Immunology

## 2017-03-18 VITALS — BP 120/78 | HR 84 | Temp 98.7°F | Resp 20 | Ht 63.9 in | Wt 140.8 lb

## 2017-03-18 DIAGNOSIS — J3089 Other allergic rhinitis: Secondary | ICD-10-CM | POA: Diagnosis not present

## 2017-03-18 DIAGNOSIS — J4520 Mild intermittent asthma, uncomplicated: Secondary | ICD-10-CM

## 2017-03-18 DIAGNOSIS — H1010 Acute atopic conjunctivitis, unspecified eye: Secondary | ICD-10-CM | POA: Diagnosis not present

## 2017-03-18 MED ORDER — MONTELUKAST SODIUM 10 MG PO TABS: 10.0000 mg | ORAL_TABLET | Freq: Every day | ORAL | 5 refills | Status: DC

## 2017-03-18 MED ORDER — OLOPATADINE HCL 0.7 % OP SOLN
1.0000 [drp] | Freq: Every day | OPHTHALMIC | 5 refills | Status: DC | PRN
Start: 2017-03-18 — End: 2021-09-05

## 2017-03-18 MED ORDER — FLUTICASONE PROPIONATE HFA 220 MCG/ACT IN AERO: INHALATION_SPRAY | RESPIRATORY_TRACT | 5 refills | Status: DC

## 2017-03-18 NOTE — Patient Instructions (Addendum)
  1. Allergen avoidance measures  2. Treat and prevent inflammation:   A. OTC Nasacort 1 spray each nostril once a day  B. montelukast 10 mg tablet 1 time per day  3. Use a combination of Pazeo one drop each eye one time per day + Lotemax one drop each eye one time per day for 1 week, then decrease Lotemax to 3 times a week.  4. If needed:   A. OTC antihistamine - loratadine/cetirizine 10 mg - one time per day  B. Proventil HFA or similar 2 puffs every 4-6 hours  5. "Action plan" for asthma flare up:   A. Flovent 220 - 2 inhalations twice a day with spacer  B. use Proventil HFA or similar if needed  6. Immunotherapy?  7. Obtain fall flu vaccine every year  8. Return to clinic in 3 weeks or earlier if problem

## 2017-03-18 NOTE — Progress Notes (Signed)
Dear Dr. Precious BardSnipes,  Thank you for referring Penny Vega to the [facility] [place] on 03/18/2017.   Below is a summation of this patient's evaluation and recommendations.  Thank you for your referral. I will keep you informed about this patient's response to treatment.   If you have any questions please do not hesitate to contact me.   Sincerely,  Jessica PriestEric J. Kozlow, MD Allergy / Immunology [facility]   ______________________________________________________________________    NEW PATIENT NOTE  Referring Provider: Albin FellingSnipes, Ryan E, OD Primary Provider: Paulina FusiSchultz, Douglas E, MD Date of office visit: 03/18/2017    Subjective:   Chief Complaint:  Penny Vega (DOB: 10/11/1950) is a 66 y.o. female who presents to the clinic on 03/18/2017 with a chief complaint of Itchy Eye .      HPI: Penny Vega presents to this clinic in evaluation of problems with her eyes.  Apparently over the course of the past month she has been having intense itchy and red eyes that did not respond to over-the-counter eyedrops for which she had evaluation with Dr. Precious BardSnipes who recently treated her with Pazeo and then subsequently Lotemax. She was on Lotemax 3 times a day for 3 days and then twice a day for 3 days and is currently on her third day of one time per day and has had a very good response regarding her itchiness although her eyes are still red. There is no obvious provoking factor giving rise to this issue. She does have a history of seasonal allergies flaring up in the fall but with some involvement of the spring manifested as nasal congestion and sneezing but this has not been a particularly unusual fall for her regarding her respiratory tract symptoms. There has not been a significant environmental change that has occurred in her life. There is not a new hobby or pet or exposure to various fumes and particulate matter that may  account for this issue.  As well, she has a history of "bronchitis" but flares up a few times during the spring and usually during the fall over the course of the past decade or so. She is usually treated with systemic steroids for this issue and thus she received systemic steroids a few times a year. In 2018 she has only received 1 systemic steroid the spring and has not had an exacerbation this fall. She has been given a short acting bronchodilator to use as needed.  She apparently has had a bone densitometry scan which has been without evidence of osteoporosis performed in 2017. Apparently she does not have cataract formation.  Past Medical History:  Diagnosis Date  . Anxiety   . Asthma    last attack was 06/2010  . Depression   . Hypertension   . PONV (postoperative nausea and vomiting)   . Recurrent upper respiratory infection (URI)    bronchitis early spring  . Shortness of breath    with asthma symptoms    Past Surgical History:  Procedure Laterality Date  . APPENDECTOMY    . ELBOW SURGERY  10 yrs. ago   left   . LUMBAR LAMINECTOMY/DECOMPRESSION MICRODISCECTOMY  03/28/2011   Procedure: LUMBAR LAMINECTOMY/DECOMPRESSION MICRODISCECTOMY;  Surgeon: Mariam DollarGary P Cram;  Location: [facility];  Service: Neurosurgery;  Laterality: Left;  Lumbar Two-Three Left Microdiscectomy  . STAPEDECTOMY  20 yrs. and 25 yrs. ago  . TONSILLECTOMY      Allergies as  of 03/18/2017      Reactions   No Known Drug Allergy       Medication List      ALPRAZolam 0.5 MG tablet Commonly known as:  XANAX Take 0.25 mg by mouth at bedtime as needed. For sleep   amitriptyline 25 MG tablet Commonly known as:  ELAVIL Take 25 mg by mouth at bedtime.   amLODipine 5 MG tablet Commonly known as:  NORVASC   buPROPion 150 MG 12 hr tablet Commonly known as:  WELLBUTRIN SR bupropion HCl SR 150 mg tablet,12 hr sustained-release  TAKE ONE TABLET BY MOUTH TWICE DAILY (TAKE ALONG WITH SERTRALINE)   cetirizine 10  MG tablet Commonly known as:  ZYRTEC Take 10 mg by mouth daily as needed. For allergies   CLARITIN 10 MG tablet Generic drug:  loratadine Take 10 mg by mouth daily as needed for allergies.   Fish Oil 1000 MG Caps Take by mouth. Pt not sure of the mg/lc   hydrocortisone 2.5 % rectal cream Commonly known as:  ANUSOL-HC Place 1 application rectally as needed for anal itching.   LIPITOR 40 MG tablet Generic drug:  atorvastatin Lipitor 40 mg tablet  Take 1 tablet every day by oral route.   LOTEMAX 0.5 % ophthalmic suspension Generic drug:  loteprednol INSTILL ONE DROP IN EACH EYE THREE TIMES DAILY FOR ONE WEEK THEN TWICE DAILY FOR ONE WEEK THEN EVERY DAY FOR ONE WEEK   meloxicam 15 MG tablet Commonly known as:  MOBIC TAKE ONE TABLET BY MOUTH EVERY DAY WITH MEALS AS NEEDED   PATANASE NA Place 2 sprays into the nose daily as needed. For allergies   polyethylene glycol powder powder Commonly known as:  GLYCOLAX/MIRALAX mix 17 GRAM in a drink ONCE DAILY. MAY INCREASE TO TWICE DAILY IF needed   PROAIR HFA 108 (90 Base) MCG/ACT inhaler Generic drug:  albuterol ProAir HFA 90 mcg/actuation aerosol inhaler  INHALE TWO PUFFS BY MOUTH FOUR TIMES DAILY AS NEEDED   sertraline 100 MG tablet Commonly known as:  ZOLOFT Take 150 mg by mouth daily.   valsartan 80 MG tablet Commonly known as:  DIOVAN Take 80 mg by mouth daily.   VITAMIN D PO       Review of systems negative except as noted in HPI / PMHx or noted below:  Review of Systems  Constitutional: Negative.   HENT: Negative.   Eyes: Negative.   Respiratory: Negative.   Cardiovascular: Negative.   Gastrointestinal: Negative.   Genitourinary: Negative.   Musculoskeletal: Negative.   Skin: Negative.   Neurological: Negative.   Endo/Heme/Allergies: Negative.   Psychiatric/Behavioral: Negative.     Family History  Problem Relation Age of Onset  . Heart disease Mother   . Parkinson's disease Father   . High blood  pressure Father   . Kidney cancer Maternal Uncle   . Cancer Maternal Uncle     Social History   Social History  . Marital status: Divorced    Spouse name: N/A  . Number of children: N/A  . Years of education: N/A   Occupational History  . Not on file.   Social History Main Topics  . Smoking status: Never Smoker  . Smokeless tobacco: Never Used  . Alcohol use No  . Drug use: No  . Sexual activity: Not on file   Other Topics Concern  . Not on file   Social History Narrative  . No narrative on file    Environmental and Social history  Lives in a house with a dry environment, a dog located inside the household, carpeting in the bedroom, plastic on the bed, no plastic on the pillow, and no smoking ongoing with inside the household. She is a retired Runner, broadcasting/film/video.  Objective:   Vitals:   03/18/17 1351  BP: 120/78  Pulse: 84  Resp: 20  Temp: 98.7 F (37.1 C)   Height: 5' 3.9" (162.3 cm) Weight: 140 lb 12.8 oz (63.9 kg)  Physical Exam  Constitutional: She is well-developed, well-nourished, and in no distress.  HENT:  Head: Normocephalic. Head is without right periorbital erythema and without left periorbital erythema.  Right Ear: Tympanic membrane, external ear and ear canal normal.  Left Ear: Tympanic membrane, external ear and ear canal normal.  Nose: Nose normal. No mucosal edema or rhinorrhea.  Mouth/Throat: Oropharynx is clear and moist and mucous membranes are normal. No oropharyngeal exudate.  Eyes: Pupils are equal, round, and reactive to light. Lids are normal. Right conjunctiva is injected. Left conjunctiva is injected.  Neck: Trachea normal. No tracheal deviation present. No thyromegaly present.  Cardiovascular: Normal rate, regular rhythm, S1 normal, S2 normal and normal heart sounds.   No murmur heard. Pulmonary/Chest: Effort normal. No stridor. No tachypnea. No respiratory distress. She has no wheezes. She has no rales. She exhibits no tenderness.    Abdominal: Soft. She exhibits no distension and no mass. There is no hepatosplenomegaly. There is no tenderness. There is no rebound and no guarding.  Musculoskeletal: She exhibits no edema or tenderness.  Lymphadenopathy:       Head (right side): No tonsillar adenopathy present.       Head (left side): No tonsillar adenopathy present.    She has no cervical adenopathy.    She has no axillary adenopathy.  Neurological: She is alert. Gait normal.  Skin: No rash noted. She is not diaphoretic. No erythema. No pallor. Nails show no clubbing.  Psychiatric: Mood and affect normal.    Diagnostics: Allergy skin tests were performed. She demonstrated hypersensitivity to weeds including ragweed.  Spirometry was performed and demonstrated an FEV1 of 2.25 @ 91 % of predicted.following the administration of nebulized albuterol her FEV1 did not improve.  Assessment and Plan:    1. Seasonal allergic conjunctivitis   2. Other allergic rhinitis   3. Asthma, mild intermittent, well-controlled     1. Allergen avoidance measures  2. Treat and prevent inflammation:   A. OTC Nasacort 1 spray each nostril once a day  B. montelukast 10 mg tablet 1 time per day  3. Use a combination of Pazeo one drop each eye one time per day + Lotemax one drop each eye one time per day for 1 week, then decrease Lotemax to 3 times a week.  4. If needed:   A. OTC antihistamine - loratadine/cetirizine 10 mg - one time per day  B. Proventil HFA or similar 2 puffs every 4-6 hours  5. "Action plan" for asthma flare up:   A. Flovent 220 - 2 inhalations twice a day with spacer  B. use Proventil HFA or similar if needed  6. Immunotherapy?  7. Obtain fall flu vaccine every year  8. Return to clinic in 3 weeks or earlier if problem  I will assume that Stacyann had significant conjunctival inflammation secondary to exposure to weeds and she will continue to utilize a collection of anti-inflammatory agents for her  conjunctiva and also her upper airway and I have given her an action plan to initiate when  she develops an issue with asthma which fortunately appears to be relatively infrequent over the course of the past year. She would be a candidate for immunotherapy if she fails medical treatment. I will regroup with her in 3 weeks or earlier if there is a problem.  Jessica Priest, MD Allergy / Immunology [facility] [place]

## 2017-04-08 ENCOUNTER — Ambulatory Visit: Payer: BC Managed Care – PPO | Admitting: Allergy and Immunology

## 2017-04-08 ENCOUNTER — Encounter: Payer: Self-pay | Admitting: Allergy and Immunology

## 2017-04-08 VITALS — BP 132/84 | HR 92 | Resp 18

## 2017-04-08 DIAGNOSIS — H1010 Acute atopic conjunctivitis, unspecified eye: Secondary | ICD-10-CM | POA: Diagnosis not present

## 2017-04-08 DIAGNOSIS — J3089 Other allergic rhinitis: Secondary | ICD-10-CM | POA: Diagnosis not present

## 2017-04-08 DIAGNOSIS — J4520 Mild intermittent asthma, uncomplicated: Secondary | ICD-10-CM | POA: Diagnosis not present

## 2017-04-08 NOTE — Patient Instructions (Addendum)
  1.  Continue to perform allergen avoidance measures  2.  Continue to treat and prevent inflammation:   A. OTC Nasacort 1 spray each nostril once a day  B.  Discontinue montelukast  C.  Pazeo one drop each eye one time per day   3.  Use Lotemax one drop each eye twice a week for 2 weeks and then discontinue.  4. If needed:   A. OTC antihistamine - loratadine/cetirizine 10 mg - one time per day  B. Proventil HFA or similar 2 puffs every 4-6 hours  5. "Action plan" for asthma flare up:   A. Flovent 220 - 2 inhalations twice a day with spacer  B. use Proventil HFA or similar if needed  6. Return to clinic in 4 months or earlier if problem

## 2017-04-08 NOTE — Progress Notes (Signed)
Follow-up Note  Referring Provider: Paulina FusiSchultz, Douglas E, MD Primary Provider: Paulina FusiSchultz, Douglas E, MD Date of Office Visit: 04/08/2017  Subjective:   Penny Vega (DOB: April 15, 1951) is a 66 y.o. female who [facility]2017 in re-evaluation of the following:  HPI: Penny Vega returns to this clinic in reevaluation of her mild intermittent asthma and allergic rhinoconjunctivitis.  I last saw her in his clinic during her initial evaluation 18 March 2017.  She is much better at this point.  She has no problems with her eyes.  They are not itchy and they are not red.  She has no problems with her nose.  She has had no need to activate an action plan for a asthma flare.  She did receive the flu vaccine this year.  Allergies as of 04/08/2017   No Known Allergies     Medication List      ALPRAZolam 0.5 MG tablet Commonly known as:  XANAX Take 0.25 mg by mouth at bedtime as needed. For sleep   amitriptyline 25 MG tablet Commonly known as:  ELAVIL Take 25 mg by mouth at bedtime.   amLODipine 5 MG tablet Commonly known as:  NORVASC   buPROPion 150 MG 12 hr tablet Commonly known as:  WELLBUTRIN SR bupropion HCl SR 150 mg tablet,12 hr sustained-release  TAKE ONE TABLET BY MOUTH TWICE DAILY (TAKE ALONG WITH SERTRALINE)   cetirizine 10 MG tablet Commonly known as:  ZYRTEC Take 10 mg by mouth daily as needed. For allergies   CLARITIN 10 MG tablet Generic drug:  loratadine Take 10 mg by mouth daily as needed for allergies.   Fish Oil 1000 MG Caps Take by mouth. Pt not sure of the mg/lc   fluticasone 220 MCG/ACT inhaler Commonly known as:  FLOVENT HFA Inhale two puffs using spacer twice daily during asthma flare-up as directed.  Rinse, gargle, and spit after use.   hydrocortisone 2.5 % rectal cream Commonly known as:  ANUSOL-HC Place 1 application rectally as needed for anal itching.   LIPITOR 40 MG tablet Generic drug:   atorvastatin Lipitor 40 mg tablet  Take 1 tablet every day by oral route.   LOTEMAX 0.5 % ophthalmic suspension Generic drug:  loteprednol INSTILL ONE DROP IN EACH EYE THREE TIMES DAILY FOR ONE WEEK THEN TWICE DAILY FOR ONE WEEK THEN EVERY DAY FOR ONE WEEK   meloxicam 15 MG tablet Commonly known as:  MOBIC TAKE ONE TABLET BY MOUTH EVERY DAY WITH MEALS AS NEEDED   montelukast 10 MG tablet Commonly known as:  SINGULAIR Take 1 tablet (10 mg total) by mouth at bedtime.   Olopatadine HCl 0.7 % Soln Commonly known as:  PAZEO Place 1 drop into both eyes daily as needed (for itchy eyes).   PATANASE NA Place 2 sprays into the nose daily as needed. For allergies   polyethylene glycol powder powder Commonly known as:  GLYCOLAX/MIRALAX mix 17 GRAM in a drink ONCE DAILY. MAY INCREASE TO TWICE DAILY IF needed   PROAIR HFA 108 (90 Base) MCG/ACT inhaler Generic drug:  albuterol ProAir HFA 90 mcg/actuation aerosol inhaler  INHALE TWO PUFFS BY MOUTH FOUR TIMES DAILY AS NEEDED   sertraline 100 MG tablet Commonly known as:  ZOLOFT Take 150 mg by mouth daily.   TRAMADOL HCL PO Take by mouth.   valsartan 80 MG tablet Commonly known as:  DIOVAN Take 80 mg by mouth daily.   VITAMIN D PO  Past Medical History:  Diagnosis Date  . Anxiety   . Asthma    last attack was 06/2010  . Depression   . Hypertension   . PONV (postoperative nausea and vomiting)   . Recurrent upper respiratory infection (URI)    bronchitis early spring  . Shortness of breath    with asthma symptoms    Past Surgical History:  Procedure Laterality Date  . APPENDECTOMY    . ELBOW SURGERY  10 yrs. ago   left   . LUMBAR LAMINECTOMY/DECOMPRESSION MICRODISCECTOMY Left 03/28/2011   Performed by Mariam Dollarram, Gary P, [facility]  . STAPEDECTOMY  20 yrs. and 25 yrs. ago  . TONSILLECTOMY      Review of systems negative except as noted in HPI / PMHx or noted below:  Review of Systems  Constitutional:  Negative.   HENT: Negative.   Eyes: Negative.   Respiratory: Negative.   Cardiovascular: Negative.   Gastrointestinal: Negative.   Genitourinary: Negative.   Musculoskeletal: Negative.   Skin: Negative.   Neurological: Negative.   Endo/Heme/Allergies: Negative.   Psychiatric/Behavioral: Negative.      Objective:   Vitals:   04/08/17 1622  BP: 132/84  Pulse: 92  Resp: 18          Physical Exam  Constitutional: She is well-developed, well-nourished, and in no distress.  HENT:  Head: Normocephalic.  Right Ear: Tympanic membrane, external ear and ear canal normal.  Left Ear: Tympanic membrane, external ear and ear canal normal.  Nose: Nose normal. No mucosal edema or rhinorrhea.  Mouth/Throat: Uvula is midline, oropharynx is clear and moist and mucous membranes are normal. No oropharyngeal exudate.  Eyes: Conjunctivae are normal.  Neck: Trachea normal. No tracheal tenderness present. No tracheal deviation present. No thyromegaly present.  Cardiovascular: Normal rate, regular rhythm, S1 normal, S2 normal and normal heart sounds.  No murmur heard. Pulmonary/Chest: Breath sounds normal. No stridor. No respiratory distress. She has no wheezes. She has no rales.  Musculoskeletal: She exhibits no edema.  Lymphadenopathy:       Head (right side): No tonsillar adenopathy present.       Head (left side): No tonsillar adenopathy present.    She has no cervical adenopathy.  Neurological: She is alert. Gait normal.  Skin: No rash noted. She is not diaphoretic. No erythema. Nails show no clubbing.  Psychiatric: Mood and affect normal.    Diagnostics:    Spirometry was performed and demonstrated an FEV1 of 2.12 at 86 % of predicted.  The patient had an Asthma Control Test with the following results: ACT Total Score: 23.    Assessment and Plan:   1. Other allergic rhinitis   2. Seasonal allergic conjunctivitis   3. Asthma, mild intermittent, well-controlled     1.  Continue  to perform allergen avoidance measures  2.  Continue to treat and prevent inflammation:   A. OTC Nasacort 1 spray each nostril once a day  B.  Discontinue montelukast  C.  Pazeo one drop each eye one time per day   3.  Use Lotemax one drop each eye twice a week for 2 weeks and then discontinue.  4. If needed:   A. OTC antihistamine - loratadine/cetirizine 10 mg - one time per day  B. Proventil HFA or similar 2 puffs every 4-6 hours  5. "Action plan" for asthma flare up:   A. Flovent 220 - 2 inhalations twice a day with spacer  B. use Proventil HFA or  similar if needed  6. Return to clinic in 4 months or earlier if problem  Keonna appears to be doing quite well on her current plan and she will now make an attempt to further consolidate her medical therapy by slowly tapering off her Lotemax.  If she does well I will see her back in this clinic in 4 months or earlier if there is a problem.  Laurette Schimke, MD Allergy / Immunology [facility]

## 2017-04-09 ENCOUNTER — Encounter: Payer: Self-pay | Admitting: Allergy and Immunology

## 2020-01-19 DIAGNOSIS — R12 Heartburn: Secondary | ICD-10-CM | POA: Diagnosis not present

## 2020-01-19 DIAGNOSIS — R1310 Dysphagia, unspecified: Secondary | ICD-10-CM | POA: Diagnosis not present

## 2020-01-27 DIAGNOSIS — R1310 Dysphagia, unspecified: Secondary | ICD-10-CM | POA: Diagnosis not present

## 2020-01-27 DIAGNOSIS — K2930 Chronic superficial gastritis without bleeding: Secondary | ICD-10-CM | POA: Diagnosis not present

## 2020-01-27 DIAGNOSIS — K219 Gastro-esophageal reflux disease without esophagitis: Secondary | ICD-10-CM | POA: Diagnosis not present

## 2020-02-11 DIAGNOSIS — Z0000 Encounter for general adult medical examination without abnormal findings: Secondary | ICD-10-CM | POA: Diagnosis not present

## 2020-02-11 DIAGNOSIS — Z1331 Encounter for screening for depression: Secondary | ICD-10-CM | POA: Diagnosis not present

## 2020-02-11 DIAGNOSIS — E785 Hyperlipidemia, unspecified: Secondary | ICD-10-CM | POA: Diagnosis not present

## 2020-02-11 DIAGNOSIS — Z139 Encounter for screening, unspecified: Secondary | ICD-10-CM | POA: Diagnosis not present

## 2020-02-11 DIAGNOSIS — Z9181 History of falling: Secondary | ICD-10-CM | POA: Diagnosis not present

## 2020-03-02 DIAGNOSIS — N959 Unspecified menopausal and perimenopausal disorder: Secondary | ICD-10-CM | POA: Diagnosis not present

## 2020-03-02 DIAGNOSIS — M8589 Other specified disorders of bone density and structure, multiple sites: Secondary | ICD-10-CM | POA: Diagnosis not present

## 2020-04-04 DIAGNOSIS — I10 Essential (primary) hypertension: Secondary | ICD-10-CM | POA: Diagnosis not present

## 2020-04-04 DIAGNOSIS — E785 Hyperlipidemia, unspecified: Secondary | ICD-10-CM | POA: Diagnosis not present

## 2020-04-04 DIAGNOSIS — J45909 Unspecified asthma, uncomplicated: Secondary | ICD-10-CM | POA: Diagnosis not present

## 2020-04-04 DIAGNOSIS — K219 Gastro-esophageal reflux disease without esophagitis: Secondary | ICD-10-CM | POA: Diagnosis not present

## 2020-04-04 DIAGNOSIS — E559 Vitamin D deficiency, unspecified: Secondary | ICD-10-CM | POA: Diagnosis not present

## 2020-04-04 DIAGNOSIS — R7301 Impaired fasting glucose: Secondary | ICD-10-CM | POA: Diagnosis not present

## 2020-04-04 DIAGNOSIS — G43009 Migraine without aura, not intractable, without status migrainosus: Secondary | ICD-10-CM | POA: Diagnosis not present

## 2020-04-04 DIAGNOSIS — F419 Anxiety disorder, unspecified: Secondary | ICD-10-CM | POA: Diagnosis not present

## 2020-04-04 DIAGNOSIS — G2581 Restless legs syndrome: Secondary | ICD-10-CM | POA: Diagnosis not present

## 2020-05-11 DIAGNOSIS — Z20822 Contact with and (suspected) exposure to covid-19: Secondary | ICD-10-CM | POA: Diagnosis not present

## 2020-08-02 DIAGNOSIS — H5203 Hypermetropia, bilateral: Secondary | ICD-10-CM | POA: Diagnosis not present

## 2020-08-02 DIAGNOSIS — H35381 Toxic maculopathy, right eye: Secondary | ICD-10-CM | POA: Diagnosis not present

## 2020-08-02 DIAGNOSIS — H353131 Nonexudative age-related macular degeneration, bilateral, early dry stage: Secondary | ICD-10-CM | POA: Diagnosis not present

## 2020-08-02 DIAGNOSIS — H52222 Regular astigmatism, left eye: Secondary | ICD-10-CM | POA: Diagnosis not present

## 2020-08-02 DIAGNOSIS — H524 Presbyopia: Secondary | ICD-10-CM | POA: Diagnosis not present

## 2020-08-02 DIAGNOSIS — H25813 Combined forms of age-related cataract, bilateral: Secondary | ICD-10-CM | POA: Diagnosis not present

## 2020-09-12 DIAGNOSIS — M25512 Pain in left shoulder: Secondary | ICD-10-CM | POA: Diagnosis not present

## 2020-09-12 DIAGNOSIS — M150 Primary generalized (osteo)arthritis: Secondary | ICD-10-CM | POA: Diagnosis not present

## 2020-09-12 DIAGNOSIS — Z6823 Body mass index (BMI) 23.0-23.9, adult: Secondary | ICD-10-CM | POA: Diagnosis not present

## 2020-09-27 DIAGNOSIS — M150 Primary generalized (osteo)arthritis: Secondary | ICD-10-CM | POA: Diagnosis not present

## 2020-09-27 DIAGNOSIS — Z6823 Body mass index (BMI) 23.0-23.9, adult: Secondary | ICD-10-CM | POA: Diagnosis not present

## 2020-09-27 DIAGNOSIS — M25512 Pain in left shoulder: Secondary | ICD-10-CM | POA: Diagnosis not present

## 2020-10-03 DIAGNOSIS — I10 Essential (primary) hypertension: Secondary | ICD-10-CM | POA: Diagnosis not present

## 2020-10-03 DIAGNOSIS — E785 Hyperlipidemia, unspecified: Secondary | ICD-10-CM | POA: Diagnosis not present

## 2020-10-03 DIAGNOSIS — F419 Anxiety disorder, unspecified: Secondary | ICD-10-CM | POA: Diagnosis not present

## 2020-10-03 DIAGNOSIS — R7301 Impaired fasting glucose: Secondary | ICD-10-CM | POA: Diagnosis not present

## 2020-10-03 DIAGNOSIS — E559 Vitamin D deficiency, unspecified: Secondary | ICD-10-CM | POA: Diagnosis not present

## 2020-10-03 DIAGNOSIS — J019 Acute sinusitis, unspecified: Secondary | ICD-10-CM | POA: Diagnosis not present

## 2020-10-03 DIAGNOSIS — B9689 Other specified bacterial agents as the cause of diseases classified elsewhere: Secondary | ICD-10-CM | POA: Diagnosis not present

## 2020-10-03 DIAGNOSIS — K219 Gastro-esophageal reflux disease without esophagitis: Secondary | ICD-10-CM | POA: Diagnosis not present

## 2020-10-21 DIAGNOSIS — H25813 Combined forms of age-related cataract, bilateral: Secondary | ICD-10-CM | POA: Diagnosis not present

## 2020-10-21 DIAGNOSIS — H353131 Nonexudative age-related macular degeneration, bilateral, early dry stage: Secondary | ICD-10-CM | POA: Diagnosis not present

## 2020-10-21 DIAGNOSIS — H40033 Anatomical narrow angle, bilateral: Secondary | ICD-10-CM | POA: Diagnosis not present

## 2020-11-01 DIAGNOSIS — H259 Unspecified age-related cataract: Secondary | ICD-10-CM | POA: Diagnosis not present

## 2020-11-01 DIAGNOSIS — H25812 Combined forms of age-related cataract, left eye: Secondary | ICD-10-CM | POA: Diagnosis not present

## 2020-11-01 DIAGNOSIS — F419 Anxiety disorder, unspecified: Secondary | ICD-10-CM | POA: Diagnosis not present

## 2020-11-01 DIAGNOSIS — J45909 Unspecified asthma, uncomplicated: Secondary | ICD-10-CM | POA: Diagnosis not present

## 2020-11-01 DIAGNOSIS — I2510 Atherosclerotic heart disease of native coronary artery without angina pectoris: Secondary | ICD-10-CM | POA: Diagnosis not present

## 2020-11-01 DIAGNOSIS — E785 Hyperlipidemia, unspecified: Secondary | ICD-10-CM | POA: Diagnosis not present

## 2020-11-01 DIAGNOSIS — H52222 Regular astigmatism, left eye: Secondary | ICD-10-CM | POA: Diagnosis not present

## 2020-11-01 DIAGNOSIS — Z961 Presence of intraocular lens: Secondary | ICD-10-CM | POA: Diagnosis not present

## 2020-11-01 DIAGNOSIS — H5212 Myopia, left eye: Secondary | ICD-10-CM | POA: Diagnosis not present

## 2020-11-01 DIAGNOSIS — H26492 Other secondary cataract, left eye: Secondary | ICD-10-CM | POA: Diagnosis not present

## 2020-11-01 DIAGNOSIS — I10 Essential (primary) hypertension: Secondary | ICD-10-CM | POA: Diagnosis not present

## 2020-12-13 DIAGNOSIS — H52223 Regular astigmatism, bilateral: Secondary | ICD-10-CM | POA: Diagnosis not present

## 2020-12-13 DIAGNOSIS — Z9842 Cataract extraction status, left eye: Secondary | ICD-10-CM | POA: Diagnosis not present

## 2020-12-13 DIAGNOSIS — H25811 Combined forms of age-related cataract, right eye: Secondary | ICD-10-CM | POA: Diagnosis not present

## 2020-12-13 DIAGNOSIS — Z961 Presence of intraocular lens: Secondary | ICD-10-CM | POA: Diagnosis not present

## 2020-12-13 DIAGNOSIS — H5211 Myopia, right eye: Secondary | ICD-10-CM | POA: Diagnosis not present

## 2020-12-13 DIAGNOSIS — J45909 Unspecified asthma, uncomplicated: Secondary | ICD-10-CM | POA: Diagnosis not present

## 2020-12-13 DIAGNOSIS — H25812 Combined forms of age-related cataract, left eye: Secondary | ICD-10-CM | POA: Diagnosis not present

## 2020-12-13 DIAGNOSIS — G4733 Obstructive sleep apnea (adult) (pediatric): Secondary | ICD-10-CM | POA: Diagnosis not present

## 2020-12-13 DIAGNOSIS — Z9841 Cataract extraction status, right eye: Secondary | ICD-10-CM | POA: Diagnosis not present

## 2020-12-13 DIAGNOSIS — I10 Essential (primary) hypertension: Secondary | ICD-10-CM | POA: Diagnosis not present

## 2020-12-13 DIAGNOSIS — H259 Unspecified age-related cataract: Secondary | ICD-10-CM | POA: Diagnosis not present

## 2020-12-23 DIAGNOSIS — M25512 Pain in left shoulder: Secondary | ICD-10-CM | POA: Diagnosis not present

## 2020-12-23 DIAGNOSIS — M150 Primary generalized (osteo)arthritis: Secondary | ICD-10-CM | POA: Diagnosis not present

## 2020-12-23 DIAGNOSIS — Z6823 Body mass index (BMI) 23.0-23.9, adult: Secondary | ICD-10-CM | POA: Diagnosis not present

## 2021-01-06 DIAGNOSIS — Z6823 Body mass index (BMI) 23.0-23.9, adult: Secondary | ICD-10-CM | POA: Diagnosis not present

## 2021-01-06 DIAGNOSIS — M25512 Pain in left shoulder: Secondary | ICD-10-CM | POA: Diagnosis not present

## 2021-01-06 DIAGNOSIS — M150 Primary generalized (osteo)arthritis: Secondary | ICD-10-CM | POA: Diagnosis not present

## 2021-01-12 DIAGNOSIS — Z124 Encounter for screening for malignant neoplasm of cervix: Secondary | ICD-10-CM | POA: Diagnosis not present

## 2021-01-12 DIAGNOSIS — Z01419 Encounter for gynecological examination (general) (routine) without abnormal findings: Secondary | ICD-10-CM | POA: Diagnosis not present

## 2021-01-12 DIAGNOSIS — Z6823 Body mass index (BMI) 23.0-23.9, adult: Secondary | ICD-10-CM | POA: Diagnosis not present

## 2021-02-14 DIAGNOSIS — Z9181 History of falling: Secondary | ICD-10-CM | POA: Diagnosis not present

## 2021-02-14 DIAGNOSIS — Z0000 Encounter for general adult medical examination without abnormal findings: Secondary | ICD-10-CM | POA: Diagnosis not present

## 2021-02-14 DIAGNOSIS — E785 Hyperlipidemia, unspecified: Secondary | ICD-10-CM | POA: Diagnosis not present

## 2021-02-14 DIAGNOSIS — Z1331 Encounter for screening for depression: Secondary | ICD-10-CM | POA: Diagnosis not present

## 2021-02-14 DIAGNOSIS — Z139 Encounter for screening, unspecified: Secondary | ICD-10-CM | POA: Diagnosis not present

## 2021-02-27 DIAGNOSIS — Z1231 Encounter for screening mammogram for malignant neoplasm of breast: Secondary | ICD-10-CM | POA: Diagnosis not present

## 2021-04-05 DIAGNOSIS — E559 Vitamin D deficiency, unspecified: Secondary | ICD-10-CM | POA: Diagnosis not present

## 2021-04-05 DIAGNOSIS — E785 Hyperlipidemia, unspecified: Secondary | ICD-10-CM | POA: Diagnosis not present

## 2021-04-05 DIAGNOSIS — F419 Anxiety disorder, unspecified: Secondary | ICD-10-CM | POA: Diagnosis not present

## 2021-04-05 DIAGNOSIS — R7301 Impaired fasting glucose: Secondary | ICD-10-CM | POA: Diagnosis not present

## 2021-04-05 DIAGNOSIS — I10 Essential (primary) hypertension: Secondary | ICD-10-CM | POA: Diagnosis not present

## 2021-04-05 DIAGNOSIS — H04123 Dry eye syndrome of bilateral lacrimal glands: Secondary | ICD-10-CM | POA: Diagnosis not present

## 2021-04-05 DIAGNOSIS — G43009 Migraine without aura, not intractable, without status migrainosus: Secondary | ICD-10-CM | POA: Diagnosis not present

## 2021-04-05 DIAGNOSIS — K219 Gastro-esophageal reflux disease without esophagitis: Secondary | ICD-10-CM | POA: Diagnosis not present

## 2021-09-05 ENCOUNTER — Encounter: Payer: Self-pay | Admitting: Podiatry

## 2021-09-05 ENCOUNTER — Ambulatory Visit (INDEPENDENT_AMBULATORY_CARE_PROVIDER_SITE_OTHER): Payer: Medicare PPO

## 2021-09-05 ENCOUNTER — Other Ambulatory Visit: Payer: Self-pay | Admitting: Podiatry

## 2021-09-05 ENCOUNTER — Ambulatory Visit: Payer: PRIVATE HEALTH INSURANCE | Admitting: Podiatry

## 2021-09-05 DIAGNOSIS — M7671 Peroneal tendinitis, right leg: Secondary | ICD-10-CM | POA: Diagnosis not present

## 2021-09-05 DIAGNOSIS — M778 Other enthesopathies, not elsewhere classified: Secondary | ICD-10-CM

## 2021-09-05 DIAGNOSIS — F419 Anxiety disorder, unspecified: Secondary | ICD-10-CM | POA: Insufficient documentation

## 2021-09-05 DIAGNOSIS — N951 Menopausal and female climacteric states: Secondary | ICD-10-CM | POA: Insufficient documentation

## 2021-09-05 DIAGNOSIS — I10 Essential (primary) hypertension: Secondary | ICD-10-CM | POA: Insufficient documentation

## 2021-09-05 DIAGNOSIS — N393 Stress incontinence (female) (male): Secondary | ICD-10-CM | POA: Insufficient documentation

## 2021-09-05 DIAGNOSIS — E7800 Pure hypercholesterolemia, unspecified: Secondary | ICD-10-CM | POA: Insufficient documentation

## 2021-09-05 DIAGNOSIS — R32 Unspecified urinary incontinence: Secondary | ICD-10-CM | POA: Insufficient documentation

## 2021-09-05 MED ORDER — METHYLPREDNISOLONE 4 MG PO TBPK: ORAL_TABLET | ORAL | 0 refills | Status: AC

## 2021-09-05 NOTE — Progress Notes (Signed)
?Subjective:  ?Patient ID: Penny Vega, female    DOB: 11/23/1950,  MRN: [id number] ?HPI ?Chief Complaint  ?Patient presents with  ? Foot Pain  ?  Lateral/medial foot right - swollen, discolored, aching x 1 month, no injury, tried voltaren gel, ice, wrap, meloxicam  ? New Patient (Initial Visit)  ?  Est pt 2015  ? ? ?71 y.o. female presents with the above complaint.  ? ?ROS: Denies fever chills nausea vomiting muscle aches pains calf pain back pain chest pain shortness of breath. ? ?Past Medical History:  ?Diagnosis Date  ? Anxiety   ? Asthma   ? last attack was 06/2010  ? Depression   ? Hypertension   ? PONV (postoperative nausea and vomiting)   ? Recurrent upper respiratory infection (URI)   ? bronchitis early spring  ? Shortness of breath   ? with asthma symptoms  ? ?Past Surgical History:  ?Procedure Laterality Date  ? APPENDECTOMY    ? ELBOW SURGERY  10 yrs. ago  ? left   ? LUMBAR LAMINECTOMY/DECOMPRESSION MICRODISCECTOMY  03/28/2011  ? Procedure: LUMBAR LAMINECTOMY/DECOMPRESSION MICRODISCECTOMY;  Surgeon: Elaina Hoops;  Location: [facility];  Service: Neurosurgery;  Laterality: Left;  Lumbar Two-Three Left Microdiscectomy  ? STAPEDECTOMY  20 yrs. and 25 yrs. ago  ? TONSILLECTOMY    ? ? ?Current Outpatient Medications:  ?  methylPREDNISolone (MEDROL DOSEPAK) 4 MG TBPK tablet, 6 day dose pack - take as directed, Disp: 21 tablet, Rfl: 0 ?  ALPRAZolam (XANAX) 0.25 MG tablet, Take by mouth., Disp: , Rfl:  ?  amitriptyline (ELAVIL) 25 MG tablet, Take 25 mg by mouth at bedtime.  , Disp: , Rfl:  ?  amLODipine (NORVASC) 5 MG tablet, , Disp: , Rfl:  ?  atorvastatin (LIPITOR) 80 MG tablet, Take by mouth., Disp: , Rfl:  ?  buPROPion (WELLBUTRIN SR) 150 MG 12 hr tablet, bupropion HCl SR 150 mg tablet,12 hr sustained-release  TAKE ONE TABLET BY MOUTH TWICE DAILY (TAKE ALONG WITH SERTRALINE), Disp: , Rfl:  ?  cetirizine (ZYRTEC) 10 MG tablet, Take 10 mg by mouth daily as needed. For allergies , Disp: , Rfl:  ?  meloxicam  (MOBIC) 15 MG tablet, TAKE ONE TABLET BY MOUTH EVERY DAY WITH MEALS AS NEEDED, Disp: , Rfl: 5 ?  pantoprazole (PROTONIX) 40 MG tablet, Take by mouth., Disp: , Rfl:  ?  sertraline (ZOLOFT) 100 MG tablet, Take 150 mg by mouth daily.  , Disp: , Rfl:  ?  TRAMADOL HCL PO, Take by mouth., Disp: , Rfl:  ?  valsartan-hydrochlorothiazide (DIOVAN-HCT) 320-25 MG tablet, Take by mouth., Disp: , Rfl:  ? ?No Known Allergies ?Review of Systems ?Objective:  ?There were no vitals filed for this visit. ? ?General: Well developed, nourished, in no acute distress, alert and oriented x3  ? ?Dermatological: Skin is warm, dry and supple bilateral. Nails x 10 are well maintained; remaining integument appears unremarkable at this time. There are no open sores, no preulcerative lesions, no rash or signs of infection present. ? ?Vascular: Dorsalis Pedis artery and Posterior Tibial artery pedal pulses are 2/4 bilateral with immedate capillary fill time. Pedal hair growth present. No varicosities and no lower extremity edema present bilateral.  ? ?Neruologic: Grossly intact via light touch bilateral. Vibratory intact via tuning fork bilateral. Protective threshold with Semmes Wienstein monofilament intact to all pedal sites bilateral. Patellar and Achilles deep tendon reflexes 2+ bilateral. No Babinski or clonus noted bilateral.  ? ?Musculoskeletal: No gross  boney pedal deformities bilateral. No pain, crepitus, or limitation noted with foot and ankle range of motion bilateral. Muscular strength 5/5 in all groups tested bilateral.  She has pain on palpation of the peroneus brevis tendon as well as the posterior tibial tendon.  There is no pain on palpation of the calcaneus or at the plantar fascia calcaneal insertion site. ? ?Gait: Unassisted, Nonantalgic.  ? ? ?Radiographs: ? 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  I do not see  any fractures though there is soft tissue swelling medially and laterally in those tendon groups. ? ?Assessment & Plan:  ? ?Assessment: Posterior tibial tendinitis and peroneal tendinitis right foot. ? ?Plan: Discussed etiology pathology conservative versus surgical therapies.  At this point placed her in a compression anklet and a short cam boot.  I encouraged rest ice compression and elevation is much as possible.  Also put her in a short cam boot and start her on methylprednisolone.  I like to follow-up with her in about a month if not improved may consider CT scans or ultrasounds of those tendons primarily she cannot have an MRI because wires in her ears. ? ? ? ? ?Penny Vega, DPM ?

## 2021-09-28 DIAGNOSIS — S62001A Unspecified fracture of navicular [scaphoid] bone of right wrist, initial encounter for closed fracture: Secondary | ICD-10-CM | POA: Diagnosis not present

## 2021-09-28 DIAGNOSIS — S59901A Unspecified injury of right elbow, initial encounter: Secondary | ICD-10-CM | POA: Diagnosis not present

## 2021-09-28 DIAGNOSIS — S52501A Unspecified fracture of the lower end of right radius, initial encounter for closed fracture: Secondary | ICD-10-CM | POA: Diagnosis not present

## 2021-09-28 DIAGNOSIS — S52591A Other fractures of lower end of right radius, initial encounter for closed fracture: Secondary | ICD-10-CM | POA: Diagnosis not present

## 2021-09-28 DIAGNOSIS — M25531 Pain in right wrist: Secondary | ICD-10-CM | POA: Diagnosis not present

## 2021-10-02 DIAGNOSIS — S52531A Colles' fracture of right radius, initial encounter for closed fracture: Secondary | ICD-10-CM | POA: Diagnosis not present

## 2021-10-05 ENCOUNTER — Ambulatory Visit: Payer: BC Managed Care – PPO | Admitting: Podiatry

## 2021-10-10 DIAGNOSIS — S52531A Colles' fracture of right radius, initial encounter for closed fracture: Secondary | ICD-10-CM | POA: Diagnosis not present

## 2021-10-17 DIAGNOSIS — S52531A Colles' fracture of right radius, initial encounter for closed fracture: Secondary | ICD-10-CM | POA: Diagnosis not present

## 2021-11-10 DIAGNOSIS — S52531A Colles' fracture of right radius, initial encounter for closed fracture: Secondary | ICD-10-CM | POA: Diagnosis not present

## 2021-11-13 DIAGNOSIS — E785 Hyperlipidemia, unspecified: Secondary | ICD-10-CM | POA: Diagnosis not present

## 2021-11-13 DIAGNOSIS — G43009 Migraine without aura, not intractable, without status migrainosus: Secondary | ICD-10-CM | POA: Diagnosis not present

## 2021-11-13 DIAGNOSIS — I10 Essential (primary) hypertension: Secondary | ICD-10-CM | POA: Diagnosis not present

## 2021-11-13 DIAGNOSIS — H04123 Dry eye syndrome of bilateral lacrimal glands: Secondary | ICD-10-CM | POA: Diagnosis not present

## 2021-11-13 DIAGNOSIS — K219 Gastro-esophageal reflux disease without esophagitis: Secondary | ICD-10-CM | POA: Diagnosis not present

## 2021-11-13 DIAGNOSIS — E559 Vitamin D deficiency, unspecified: Secondary | ICD-10-CM | POA: Diagnosis not present

## 2021-11-13 DIAGNOSIS — F419 Anxiety disorder, unspecified: Secondary | ICD-10-CM | POA: Diagnosis not present

## 2021-11-13 DIAGNOSIS — R7301 Impaired fasting glucose: Secondary | ICD-10-CM | POA: Diagnosis not present

## 2021-12-11 DIAGNOSIS — S52531A Colles' fracture of right radius, initial encounter for closed fracture: Secondary | ICD-10-CM | POA: Diagnosis not present

## 2021-12-21 DIAGNOSIS — M25441 Effusion, right hand: Secondary | ICD-10-CM | POA: Diagnosis not present

## 2021-12-21 DIAGNOSIS — S52531D Colles' fracture of right radius, subsequent encounter for closed fracture with routine healing: Secondary | ICD-10-CM | POA: Diagnosis not present

## 2021-12-21 DIAGNOSIS — M25641 Stiffness of right hand, not elsewhere classified: Secondary | ICD-10-CM | POA: Diagnosis not present

## 2021-12-21 DIAGNOSIS — M79641 Pain in right hand: Secondary | ICD-10-CM | POA: Diagnosis not present

## 2021-12-21 DIAGNOSIS — M62541 Muscle wasting and atrophy, not elsewhere classified, right hand: Secondary | ICD-10-CM | POA: Diagnosis not present

## 2021-12-28 DIAGNOSIS — M79641 Pain in right hand: Secondary | ICD-10-CM | POA: Diagnosis not present

## 2021-12-28 DIAGNOSIS — M25641 Stiffness of right hand, not elsewhere classified: Secondary | ICD-10-CM | POA: Diagnosis not present

## 2021-12-28 DIAGNOSIS — M62541 Muscle wasting and atrophy, not elsewhere classified, right hand: Secondary | ICD-10-CM | POA: Diagnosis not present

## 2021-12-28 DIAGNOSIS — S52531D Colles' fracture of right radius, subsequent encounter for closed fracture with routine healing: Secondary | ICD-10-CM | POA: Diagnosis not present

## 2021-12-28 DIAGNOSIS — M25441 Effusion, right hand: Secondary | ICD-10-CM | POA: Diagnosis not present

## 2021-12-29 DIAGNOSIS — M25641 Stiffness of right hand, not elsewhere classified: Secondary | ICD-10-CM | POA: Diagnosis not present

## 2021-12-29 DIAGNOSIS — M25441 Effusion, right hand: Secondary | ICD-10-CM | POA: Diagnosis not present

## 2021-12-29 DIAGNOSIS — M79641 Pain in right hand: Secondary | ICD-10-CM | POA: Diagnosis not present

## 2021-12-29 DIAGNOSIS — S52531D Colles' fracture of right radius, subsequent encounter for closed fracture with routine healing: Secondary | ICD-10-CM | POA: Diagnosis not present

## 2021-12-29 DIAGNOSIS — M62541 Muscle wasting and atrophy, not elsewhere classified, right hand: Secondary | ICD-10-CM | POA: Diagnosis not present

## 2022-01-04 DIAGNOSIS — M25441 Effusion, right hand: Secondary | ICD-10-CM | POA: Diagnosis not present

## 2022-01-04 DIAGNOSIS — M79641 Pain in right hand: Secondary | ICD-10-CM | POA: Diagnosis not present

## 2022-01-04 DIAGNOSIS — M25641 Stiffness of right hand, not elsewhere classified: Secondary | ICD-10-CM | POA: Diagnosis not present

## 2022-01-04 DIAGNOSIS — M62541 Muscle wasting and atrophy, not elsewhere classified, right hand: Secondary | ICD-10-CM | POA: Diagnosis not present

## 2022-01-04 DIAGNOSIS — S52531D Colles' fracture of right radius, subsequent encounter for closed fracture with routine healing: Secondary | ICD-10-CM | POA: Diagnosis not present

## 2022-01-08 DIAGNOSIS — M25441 Effusion, right hand: Secondary | ICD-10-CM | POA: Diagnosis not present

## 2022-01-08 DIAGNOSIS — M79641 Pain in right hand: Secondary | ICD-10-CM | POA: Diagnosis not present

## 2022-01-08 DIAGNOSIS — M25641 Stiffness of right hand, not elsewhere classified: Secondary | ICD-10-CM | POA: Diagnosis not present

## 2022-01-08 DIAGNOSIS — S52531D Colles' fracture of right radius, subsequent encounter for closed fracture with routine healing: Secondary | ICD-10-CM | POA: Diagnosis not present

## 2022-01-08 DIAGNOSIS — M62541 Muscle wasting and atrophy, not elsewhere classified, right hand: Secondary | ICD-10-CM | POA: Diagnosis not present

## 2022-01-11 DIAGNOSIS — M62541 Muscle wasting and atrophy, not elsewhere classified, right hand: Secondary | ICD-10-CM | POA: Diagnosis not present

## 2022-01-11 DIAGNOSIS — M79641 Pain in right hand: Secondary | ICD-10-CM | POA: Diagnosis not present

## 2022-01-11 DIAGNOSIS — M25441 Effusion, right hand: Secondary | ICD-10-CM | POA: Diagnosis not present

## 2022-01-11 DIAGNOSIS — S52531D Colles' fracture of right radius, subsequent encounter for closed fracture with routine healing: Secondary | ICD-10-CM | POA: Diagnosis not present

## 2022-01-11 DIAGNOSIS — M25641 Stiffness of right hand, not elsewhere classified: Secondary | ICD-10-CM | POA: Diagnosis not present

## 2022-01-15 DIAGNOSIS — M79641 Pain in right hand: Secondary | ICD-10-CM | POA: Diagnosis not present

## 2022-01-15 DIAGNOSIS — S52531D Colles' fracture of right radius, subsequent encounter for closed fracture with routine healing: Secondary | ICD-10-CM | POA: Diagnosis not present

## 2022-01-15 DIAGNOSIS — M62541 Muscle wasting and atrophy, not elsewhere classified, right hand: Secondary | ICD-10-CM | POA: Diagnosis not present

## 2022-01-15 DIAGNOSIS — M25641 Stiffness of right hand, not elsewhere classified: Secondary | ICD-10-CM | POA: Diagnosis not present

## 2022-01-15 DIAGNOSIS — M25441 Effusion, right hand: Secondary | ICD-10-CM | POA: Diagnosis not present

## 2022-01-18 DIAGNOSIS — M62541 Muscle wasting and atrophy, not elsewhere classified, right hand: Secondary | ICD-10-CM | POA: Diagnosis not present

## 2022-01-18 DIAGNOSIS — M25441 Effusion, right hand: Secondary | ICD-10-CM | POA: Diagnosis not present

## 2022-01-18 DIAGNOSIS — M79641 Pain in right hand: Secondary | ICD-10-CM | POA: Diagnosis not present

## 2022-01-18 DIAGNOSIS — S52531D Colles' fracture of right radius, subsequent encounter for closed fracture with routine healing: Secondary | ICD-10-CM | POA: Diagnosis not present

## 2022-01-18 DIAGNOSIS — M25641 Stiffness of right hand, not elsewhere classified: Secondary | ICD-10-CM | POA: Diagnosis not present

## 2022-01-23 DIAGNOSIS — M25441 Effusion, right hand: Secondary | ICD-10-CM | POA: Diagnosis not present

## 2022-01-23 DIAGNOSIS — S52531D Colles' fracture of right radius, subsequent encounter for closed fracture with routine healing: Secondary | ICD-10-CM | POA: Diagnosis not present

## 2022-01-23 DIAGNOSIS — M79641 Pain in right hand: Secondary | ICD-10-CM | POA: Diagnosis not present

## 2022-01-23 DIAGNOSIS — M62541 Muscle wasting and atrophy, not elsewhere classified, right hand: Secondary | ICD-10-CM | POA: Diagnosis not present

## 2022-01-23 DIAGNOSIS — S52531A Colles' fracture of right radius, initial encounter for closed fracture: Secondary | ICD-10-CM | POA: Diagnosis not present

## 2022-01-23 DIAGNOSIS — M25641 Stiffness of right hand, not elsewhere classified: Secondary | ICD-10-CM | POA: Diagnosis not present

## 2022-01-25 DIAGNOSIS — S52531D Colles' fracture of right radius, subsequent encounter for closed fracture with routine healing: Secondary | ICD-10-CM | POA: Diagnosis not present

## 2022-01-25 DIAGNOSIS — M62541 Muscle wasting and atrophy, not elsewhere classified, right hand: Secondary | ICD-10-CM | POA: Diagnosis not present

## 2022-01-25 DIAGNOSIS — M79641 Pain in right hand: Secondary | ICD-10-CM | POA: Diagnosis not present

## 2022-01-25 DIAGNOSIS — M25441 Effusion, right hand: Secondary | ICD-10-CM | POA: Diagnosis not present

## 2022-01-25 DIAGNOSIS — M25641 Stiffness of right hand, not elsewhere classified: Secondary | ICD-10-CM | POA: Diagnosis not present

## 2022-01-29 DIAGNOSIS — M62541 Muscle wasting and atrophy, not elsewhere classified, right hand: Secondary | ICD-10-CM | POA: Diagnosis not present

## 2022-01-29 DIAGNOSIS — M25441 Effusion, right hand: Secondary | ICD-10-CM | POA: Diagnosis not present

## 2022-01-29 DIAGNOSIS — M25641 Stiffness of right hand, not elsewhere classified: Secondary | ICD-10-CM | POA: Diagnosis not present

## 2022-01-29 DIAGNOSIS — M79641 Pain in right hand: Secondary | ICD-10-CM | POA: Diagnosis not present

## 2022-01-29 DIAGNOSIS — S52531D Colles' fracture of right radius, subsequent encounter for closed fracture with routine healing: Secondary | ICD-10-CM | POA: Diagnosis not present

## 2022-01-31 DIAGNOSIS — S52531D Colles' fracture of right radius, subsequent encounter for closed fracture with routine healing: Secondary | ICD-10-CM | POA: Diagnosis not present

## 2022-01-31 DIAGNOSIS — M25641 Stiffness of right hand, not elsewhere classified: Secondary | ICD-10-CM | POA: Diagnosis not present

## 2022-01-31 DIAGNOSIS — M25441 Effusion, right hand: Secondary | ICD-10-CM | POA: Diagnosis not present

## 2022-01-31 DIAGNOSIS — M62541 Muscle wasting and atrophy, not elsewhere classified, right hand: Secondary | ICD-10-CM | POA: Diagnosis not present

## 2022-01-31 DIAGNOSIS — M79641 Pain in right hand: Secondary | ICD-10-CM | POA: Diagnosis not present

## 2022-02-01 DIAGNOSIS — Z961 Presence of intraocular lens: Secondary | ICD-10-CM | POA: Diagnosis not present

## 2022-02-01 DIAGNOSIS — H353130 Nonexudative age-related macular degeneration, bilateral, stage unspecified: Secondary | ICD-10-CM | POA: Diagnosis not present

## 2022-02-01 DIAGNOSIS — H52223 Regular astigmatism, bilateral: Secondary | ICD-10-CM | POA: Diagnosis not present

## 2022-02-01 DIAGNOSIS — H26493 Other secondary cataract, bilateral: Secondary | ICD-10-CM | POA: Diagnosis not present

## 2022-02-01 DIAGNOSIS — H524 Presbyopia: Secondary | ICD-10-CM | POA: Diagnosis not present

## 2022-02-05 DIAGNOSIS — M62541 Muscle wasting and atrophy, not elsewhere classified, right hand: Secondary | ICD-10-CM | POA: Diagnosis not present

## 2022-02-05 DIAGNOSIS — M25441 Effusion, right hand: Secondary | ICD-10-CM | POA: Diagnosis not present

## 2022-02-05 DIAGNOSIS — M25641 Stiffness of right hand, not elsewhere classified: Secondary | ICD-10-CM | POA: Diagnosis not present

## 2022-02-05 DIAGNOSIS — M79641 Pain in right hand: Secondary | ICD-10-CM | POA: Diagnosis not present

## 2022-02-05 DIAGNOSIS — S52531D Colles' fracture of right radius, subsequent encounter for closed fracture with routine healing: Secondary | ICD-10-CM | POA: Diagnosis not present

## 2022-02-08 DIAGNOSIS — S52531D Colles' fracture of right radius, subsequent encounter for closed fracture with routine healing: Secondary | ICD-10-CM | POA: Diagnosis not present

## 2022-02-08 DIAGNOSIS — M25641 Stiffness of right hand, not elsewhere classified: Secondary | ICD-10-CM | POA: Diagnosis not present

## 2022-02-08 DIAGNOSIS — M79641 Pain in right hand: Secondary | ICD-10-CM | POA: Diagnosis not present

## 2022-02-08 DIAGNOSIS — M25441 Effusion, right hand: Secondary | ICD-10-CM | POA: Diagnosis not present

## 2022-02-08 DIAGNOSIS — M62541 Muscle wasting and atrophy, not elsewhere classified, right hand: Secondary | ICD-10-CM | POA: Diagnosis not present

## 2022-02-12 DIAGNOSIS — S52531D Colles' fracture of right radius, subsequent encounter for closed fracture with routine healing: Secondary | ICD-10-CM | POA: Diagnosis not present

## 2022-02-12 DIAGNOSIS — M79641 Pain in right hand: Secondary | ICD-10-CM | POA: Diagnosis not present

## 2022-02-12 DIAGNOSIS — M62541 Muscle wasting and atrophy, not elsewhere classified, right hand: Secondary | ICD-10-CM | POA: Diagnosis not present

## 2022-02-12 DIAGNOSIS — M25641 Stiffness of right hand, not elsewhere classified: Secondary | ICD-10-CM | POA: Diagnosis not present

## 2022-02-12 DIAGNOSIS — M25441 Effusion, right hand: Secondary | ICD-10-CM | POA: Diagnosis not present

## 2022-02-15 DIAGNOSIS — M25441 Effusion, right hand: Secondary | ICD-10-CM | POA: Diagnosis not present

## 2022-02-15 DIAGNOSIS — M25641 Stiffness of right hand, not elsewhere classified: Secondary | ICD-10-CM | POA: Diagnosis not present

## 2022-02-15 DIAGNOSIS — M62541 Muscle wasting and atrophy, not elsewhere classified, right hand: Secondary | ICD-10-CM | POA: Diagnosis not present

## 2022-02-15 DIAGNOSIS — M79641 Pain in right hand: Secondary | ICD-10-CM | POA: Diagnosis not present

## 2022-02-15 DIAGNOSIS — S52531D Colles' fracture of right radius, subsequent encounter for closed fracture with routine healing: Secondary | ICD-10-CM | POA: Diagnosis not present

## 2022-02-19 DIAGNOSIS — M79641 Pain in right hand: Secondary | ICD-10-CM | POA: Diagnosis not present

## 2022-02-19 DIAGNOSIS — S52531D Colles' fracture of right radius, subsequent encounter for closed fracture with routine healing: Secondary | ICD-10-CM | POA: Diagnosis not present

## 2022-02-19 DIAGNOSIS — M62541 Muscle wasting and atrophy, not elsewhere classified, right hand: Secondary | ICD-10-CM | POA: Diagnosis not present

## 2022-02-19 DIAGNOSIS — M25441 Effusion, right hand: Secondary | ICD-10-CM | POA: Diagnosis not present

## 2022-02-19 DIAGNOSIS — M25641 Stiffness of right hand, not elsewhere classified: Secondary | ICD-10-CM | POA: Diagnosis not present

## 2022-02-22 DIAGNOSIS — M25641 Stiffness of right hand, not elsewhere classified: Secondary | ICD-10-CM | POA: Diagnosis not present

## 2022-02-22 DIAGNOSIS — M79641 Pain in right hand: Secondary | ICD-10-CM | POA: Diagnosis not present

## 2022-02-22 DIAGNOSIS — S52531D Colles' fracture of right radius, subsequent encounter for closed fracture with routine healing: Secondary | ICD-10-CM | POA: Diagnosis not present

## 2022-02-22 DIAGNOSIS — M25441 Effusion, right hand: Secondary | ICD-10-CM | POA: Diagnosis not present

## 2022-02-22 DIAGNOSIS — M62541 Muscle wasting and atrophy, not elsewhere classified, right hand: Secondary | ICD-10-CM | POA: Diagnosis not present

## 2022-02-27 DIAGNOSIS — Z1231 Encounter for screening mammogram for malignant neoplasm of breast: Secondary | ICD-10-CM | POA: Diagnosis not present

## 2022-03-16 DIAGNOSIS — J019 Acute sinusitis, unspecified: Secondary | ICD-10-CM | POA: Diagnosis not present

## 2022-03-16 DIAGNOSIS — J208 Acute bronchitis due to other specified organisms: Secondary | ICD-10-CM | POA: Diagnosis not present

## 2022-04-20 DIAGNOSIS — K219 Gastro-esophageal reflux disease without esophagitis: Secondary | ICD-10-CM | POA: Diagnosis not present

## 2022-05-11 DIAGNOSIS — J208 Acute bronchitis due to other specified organisms: Secondary | ICD-10-CM | POA: Diagnosis not present

## 2022-05-24 DIAGNOSIS — Z139 Encounter for screening, unspecified: Secondary | ICD-10-CM | POA: Diagnosis not present

## 2022-05-24 DIAGNOSIS — E559 Vitamin D deficiency, unspecified: Secondary | ICD-10-CM | POA: Diagnosis not present

## 2022-05-24 DIAGNOSIS — Z1211 Encounter for screening for malignant neoplasm of colon: Secondary | ICD-10-CM | POA: Diagnosis not present

## 2022-05-24 DIAGNOSIS — H04123 Dry eye syndrome of bilateral lacrimal glands: Secondary | ICD-10-CM | POA: Diagnosis not present

## 2022-05-24 DIAGNOSIS — F419 Anxiety disorder, unspecified: Secondary | ICD-10-CM | POA: Diagnosis not present

## 2022-05-24 DIAGNOSIS — I10 Essential (primary) hypertension: Secondary | ICD-10-CM | POA: Diagnosis not present

## 2022-05-24 DIAGNOSIS — K219 Gastro-esophageal reflux disease without esophagitis: Secondary | ICD-10-CM | POA: Diagnosis not present

## 2022-05-24 DIAGNOSIS — E785 Hyperlipidemia, unspecified: Secondary | ICD-10-CM | POA: Diagnosis not present

## 2022-05-24 DIAGNOSIS — R7301 Impaired fasting glucose: Secondary | ICD-10-CM | POA: Diagnosis not present

## 2022-06-05 DIAGNOSIS — R519 Headache, unspecified: Secondary | ICD-10-CM | POA: Diagnosis not present

## 2022-06-05 DIAGNOSIS — I10 Essential (primary) hypertension: Secondary | ICD-10-CM | POA: Diagnosis not present

## 2022-06-05 DIAGNOSIS — M159 Polyosteoarthritis, unspecified: Secondary | ICD-10-CM | POA: Diagnosis not present

## 2022-06-05 DIAGNOSIS — F419 Anxiety disorder, unspecified: Secondary | ICD-10-CM | POA: Diagnosis not present

## 2022-06-05 DIAGNOSIS — M25512 Pain in left shoulder: Secondary | ICD-10-CM | POA: Diagnosis not present

## 2022-06-05 DIAGNOSIS — E7800 Pure hypercholesterolemia, unspecified: Secondary | ICD-10-CM | POA: Diagnosis not present

## 2022-06-05 DIAGNOSIS — J309 Allergic rhinitis, unspecified: Secondary | ICD-10-CM | POA: Diagnosis not present

## 2022-06-05 DIAGNOSIS — F3341 Major depressive disorder, recurrent, in partial remission: Secondary | ICD-10-CM | POA: Diagnosis not present

## 2022-06-05 DIAGNOSIS — K219 Gastro-esophageal reflux disease without esophagitis: Secondary | ICD-10-CM | POA: Diagnosis not present

## 2022-06-07 ENCOUNTER — Telehealth: Payer: Self-pay | Admitting: Gastroenterology

## 2022-06-07 NOTE — Telephone Encounter (Signed)
Hi Dr. Rush Landmark,   Supervising Provider: 05/30/22   We received a referral for patient to have another colonoscopy done. She has GI history with Dr. Carmie End office which is no longer performing procedures. Records were obtained for you to review and advise on scheduling..   Thanks

## 2022-06-09 NOTE — Telephone Encounter (Signed)
Review of outside records to be scanned into the chart  2008 colonoscopy Single pedunculated 2 cm polyp in the sigmoid colon single piece polypectomy with hot snare.  External hemorrhoids and skin tags.  Few diverticulosis in sigmoid colon.  Circumferential dermatitis around the anal area consistent with a fungal dermatitis. Pathology consistent with tubular adenoma (thus a advanced adenoma at that time)  2013 colonoscopy Single sessile polyp of the transverse colon resected with hot snare.  Polyp was not retrieved.  As it disintegrated after cautery. 2-day preparation recommended with 5-year follow-up colonoscopy recommended  April 2018 colonoscopy Normal colonoscopy to cecum with plan for 5-year colonoscopy  2021 EGD Indication dysphagia and GERD Findings of gastritis. Pathology showed esophageal biopsies of reflux changes and no eosinophilic esophagitis. Gastritis was not biopsied.  After review of this, there is the plan of action: Patient has history of advanced adenoma and thus should remain on a 5-year plan indefinitely Patient is due for colonoscopy Willing to accept this patient in transfer of care. She can be scheduled for next available [place] colonoscopy We will get her records scanned into the chart.   Justice Britain, MD [facility] Office # [phone]

## 2022-06-12 DIAGNOSIS — F3341 Major depressive disorder, recurrent, in partial remission: Secondary | ICD-10-CM | POA: Diagnosis not present

## 2022-06-12 DIAGNOSIS — E7800 Pure hypercholesterolemia, unspecified: Secondary | ICD-10-CM | POA: Diagnosis not present

## 2022-06-12 DIAGNOSIS — K219 Gastro-esophageal reflux disease without esophagitis: Secondary | ICD-10-CM | POA: Diagnosis not present

## 2022-06-12 DIAGNOSIS — I10 Essential (primary) hypertension: Secondary | ICD-10-CM | POA: Diagnosis not present

## 2022-06-12 DIAGNOSIS — M159 Polyosteoarthritis, unspecified: Secondary | ICD-10-CM | POA: Diagnosis not present

## 2022-06-12 DIAGNOSIS — M25512 Pain in left shoulder: Secondary | ICD-10-CM | POA: Diagnosis not present

## 2022-06-12 DIAGNOSIS — J309 Allergic rhinitis, unspecified: Secondary | ICD-10-CM | POA: Diagnosis not present

## 2022-06-12 DIAGNOSIS — F419 Anxiety disorder, unspecified: Secondary | ICD-10-CM | POA: Diagnosis not present

## 2022-06-12 DIAGNOSIS — R519 Headache, unspecified: Secondary | ICD-10-CM | POA: Diagnosis not present

## 2022-06-22 NOTE — Telephone Encounter (Signed)
Called patient to schedule left voicemail. 

## 2022-07-10 DIAGNOSIS — E7800 Pure hypercholesterolemia, unspecified: Secondary | ICD-10-CM | POA: Diagnosis not present

## 2022-07-10 DIAGNOSIS — R519 Headache, unspecified: Secondary | ICD-10-CM | POA: Diagnosis not present

## 2022-07-10 DIAGNOSIS — J309 Allergic rhinitis, unspecified: Secondary | ICD-10-CM | POA: Diagnosis not present

## 2022-07-10 DIAGNOSIS — Z6823 Body mass index (BMI) 23.0-23.9, adult: Secondary | ICD-10-CM | POA: Diagnosis not present

## 2022-07-10 DIAGNOSIS — K219 Gastro-esophageal reflux disease without esophagitis: Secondary | ICD-10-CM | POA: Diagnosis not present

## 2022-07-10 DIAGNOSIS — F3341 Major depressive disorder, recurrent, in partial remission: Secondary | ICD-10-CM | POA: Diagnosis not present

## 2022-07-10 DIAGNOSIS — F419 Anxiety disorder, unspecified: Secondary | ICD-10-CM | POA: Diagnosis not present

## 2022-07-10 DIAGNOSIS — I10 Essential (primary) hypertension: Secondary | ICD-10-CM | POA: Diagnosis not present

## 2022-07-10 DIAGNOSIS — M159 Polyosteoarthritis, unspecified: Secondary | ICD-10-CM | POA: Diagnosis not present

## 2022-08-06 NOTE — Telephone Encounter (Signed)
Inbound call from patient no longer wanting to schedule. Patient states she will find GI care in the area she lives in.

## 2022-08-08 DIAGNOSIS — E7800 Pure hypercholesterolemia, unspecified: Secondary | ICD-10-CM | POA: Diagnosis not present

## 2022-08-08 DIAGNOSIS — R519 Headache, unspecified: Secondary | ICD-10-CM | POA: Diagnosis not present

## 2022-08-08 DIAGNOSIS — Z0000 Encounter for general adult medical examination without abnormal findings: Secondary | ICD-10-CM | POA: Diagnosis not present

## 2022-08-08 DIAGNOSIS — Z6824 Body mass index (BMI) 24.0-24.9, adult: Secondary | ICD-10-CM | POA: Diagnosis not present

## 2022-08-08 DIAGNOSIS — J309 Allergic rhinitis, unspecified: Secondary | ICD-10-CM | POA: Diagnosis not present

## 2022-08-08 DIAGNOSIS — Z1211 Encounter for screening for malignant neoplasm of colon: Secondary | ICD-10-CM | POA: Diagnosis not present

## 2022-08-08 DIAGNOSIS — F3341 Major depressive disorder, recurrent, in partial remission: Secondary | ICD-10-CM | POA: Diagnosis not present

## 2022-08-08 DIAGNOSIS — I10 Essential (primary) hypertension: Secondary | ICD-10-CM | POA: Diagnosis not present

## 2022-08-08 DIAGNOSIS — K219 Gastro-esophageal reflux disease without esophagitis: Secondary | ICD-10-CM | POA: Diagnosis not present

## 2022-08-08 DIAGNOSIS — R5383 Other fatigue: Secondary | ICD-10-CM | POA: Diagnosis not present
